# Patient Record
Sex: Female | Born: 1965 | Hispanic: No | Smoking: Never smoker
Health system: Southern US, Community
[De-identification: ages and names within clinical notes are randomized; demographics above are authoritative.]

## PROBLEM LIST (undated history)

## (undated) DIAGNOSIS — R000 Tachycardia, unspecified: Secondary | ICD-10-CM

## (undated) DIAGNOSIS — K219 Gastro-esophageal reflux disease without esophagitis: Secondary | ICD-10-CM

## (undated) DIAGNOSIS — I4891 Unspecified atrial fibrillation: Secondary | ICD-10-CM

## (undated) DIAGNOSIS — N809 Endometriosis, unspecified: Secondary | ICD-10-CM

## (undated) DIAGNOSIS — I10 Essential (primary) hypertension: Secondary | ICD-10-CM

## (undated) DIAGNOSIS — D649 Anemia, unspecified: Secondary | ICD-10-CM

## (undated) DIAGNOSIS — K859 Acute pancreatitis without necrosis or infection, unspecified: Secondary | ICD-10-CM

## (undated) DIAGNOSIS — D219 Benign neoplasm of connective and other soft tissue, unspecified: Secondary | ICD-10-CM

## (undated) HISTORY — PX: TONSILLECTOMY: SHX5217

## (undated) HISTORY — DX: Anemia, unspecified: D64.9

---

## 1993-07-18 HISTORY — PX: LAPAROSCOPIC TUBAL LIGATION: SHX1937

## 2001-08-04 ENCOUNTER — Emergency Department (HOSPITAL_COMMUNITY): Admission: EM | Admit: 2001-08-04 | Discharge: 2001-08-05 | Payer: Self-pay | Admitting: Internal Medicine

## 2001-12-17 ENCOUNTER — Ambulatory Visit (HOSPITAL_COMMUNITY): Admission: RE | Admit: 2001-12-17 | Discharge: 2001-12-17 | Payer: Self-pay | Admitting: Internal Medicine

## 2001-12-17 ENCOUNTER — Encounter: Payer: Self-pay | Admitting: Internal Medicine

## 2004-10-13 ENCOUNTER — Emergency Department (HOSPITAL_COMMUNITY): Admission: EM | Admit: 2004-10-13 | Discharge: 2004-10-13 | Payer: Self-pay | Admitting: Emergency Medicine

## 2008-09-25 ENCOUNTER — Emergency Department (HOSPITAL_COMMUNITY): Admission: EM | Admit: 2008-09-25 | Discharge: 2008-09-25 | Payer: Self-pay | Admitting: Emergency Medicine

## 2008-09-30 ENCOUNTER — Emergency Department (HOSPITAL_COMMUNITY): Admission: EM | Admit: 2008-09-30 | Discharge: 2008-09-30 | Payer: Self-pay | Admitting: Emergency Medicine

## 2009-10-10 ENCOUNTER — Emergency Department (HOSPITAL_COMMUNITY): Admission: EM | Admit: 2009-10-10 | Discharge: 2009-10-10 | Payer: Self-pay | Admitting: Emergency Medicine

## 2010-07-18 HISTORY — PX: ERCP W/ SPHICTEROTOMY: SHX1523

## 2010-09-16 DIAGNOSIS — K859 Acute pancreatitis without necrosis or infection, unspecified: Secondary | ICD-10-CM

## 2010-09-16 HISTORY — DX: Acute pancreatitis without necrosis or infection, unspecified: K85.90

## 2010-09-16 HISTORY — PX: LAPAROSCOPIC CHOLECYSTECTOMY: SUR755

## 2010-09-19 ENCOUNTER — Inpatient Hospital Stay (HOSPITAL_COMMUNITY)
Admission: AD | Admit: 2010-09-19 | Discharge: 2010-09-25 | DRG: 418 | Disposition: A | Discharge status: Home / Self Care | Payer: Medicaid Other | Source: Ambulatory Visit | Attending: Internal Medicine | Admitting: Internal Medicine

## 2010-09-19 DIAGNOSIS — K2960 Other gastritis without bleeding: Secondary | ICD-10-CM | POA: Diagnosis present

## 2010-09-19 DIAGNOSIS — K859 Acute pancreatitis without necrosis or infection, unspecified: Secondary | ICD-10-CM

## 2010-09-19 DIAGNOSIS — K851 Biliary acute pancreatitis without necrosis or infection: Secondary | ICD-10-CM

## 2010-09-19 DIAGNOSIS — K8000 Calculus of gallbladder with acute cholecystitis without obstruction: Secondary | ICD-10-CM | POA: Diagnosis present

## 2010-09-19 DIAGNOSIS — E860 Dehydration: Secondary | ICD-10-CM | POA: Diagnosis present

## 2010-09-19 DIAGNOSIS — K8020 Calculus of gallbladder without cholecystitis without obstruction: Secondary | ICD-10-CM

## 2010-09-19 DIAGNOSIS — F411 Generalized anxiety disorder: Secondary | ICD-10-CM | POA: Diagnosis present

## 2010-09-19 DIAGNOSIS — K219 Gastro-esophageal reflux disease without esophagitis: Secondary | ICD-10-CM | POA: Diagnosis present

## 2010-09-19 DIAGNOSIS — I10 Essential (primary) hypertension: Secondary | ICD-10-CM | POA: Diagnosis present

## 2010-09-19 LAB — GLUCOSE, CAPILLARY: Glucose-Capillary: 140 mg/dL — ABNORMAL HIGH (ref 70–99)

## 2010-09-20 LAB — DIFFERENTIAL
Basophils Absolute: 0 10*3/uL (ref 0.0–0.1)
Eosinophils Relative: 0 % (ref 0–5)
Lymphocytes Relative: 5 % — ABNORMAL LOW (ref 12–46)
Monocytes Absolute: 0.6 10*3/uL (ref 0.1–1.0)

## 2010-09-20 LAB — HEMOGLOBIN A1C: Mean Plasma Glucose: 108 mg/dL (ref <117)

## 2010-09-20 LAB — CBC
HCT: 32.6 % — ABNORMAL LOW (ref 36.0–46.0)
MCH: 24.6 pg — ABNORMAL LOW (ref 26.0–34.0)
MCHC: 32.2 g/dL (ref 30.0–36.0)
MCV: 76.5 fL — ABNORMAL LOW (ref 78.0–100.0)
RDW: 16.3 % — ABNORMAL HIGH (ref 11.5–15.5)

## 2010-09-20 LAB — COMPREHENSIVE METABOLIC PANEL
Alkaline Phosphatase: 188 U/L — ABNORMAL HIGH (ref 39–117)
BUN: 6 mg/dL (ref 6–23)
CO2: 22 mEq/L (ref 19–32)
Chloride: 107 mEq/L (ref 96–112)
Glucose, Bld: 89 mg/dL (ref 70–99)
Potassium: 3 mEq/L — ABNORMAL LOW (ref 3.5–5.1)
Total Bilirubin: 1 mg/dL (ref 0.3–1.2)

## 2010-09-20 LAB — GLUCOSE, CAPILLARY
Glucose-Capillary: 92 mg/dL (ref 70–99)
Glucose-Capillary: 90 mg/dL (ref 70–99)

## 2010-09-20 LAB — AMYLASE: Amylase: 1235 U/L — ABNORMAL HIGH (ref 0–105)

## 2010-09-20 LAB — LIPASE, BLOOD: Lipase: 900 U/L — ABNORMAL HIGH (ref 11–59)

## 2010-09-21 LAB — DIFFERENTIAL
Basophils Relative: 0 % (ref 0–1)
Eosinophils Absolute: 0.1 10*3/uL (ref 0.0–0.7)
Lymphs Abs: 0.8 10*3/uL (ref 0.7–4.0)
Monocytes Absolute: 0.6 10*3/uL (ref 0.1–1.0)
Monocytes Relative: 6 % (ref 3–12)
Neutro Abs: 8.5 10*3/uL — ABNORMAL HIGH (ref 1.7–7.7)

## 2010-09-21 LAB — BASIC METABOLIC PANEL
BUN: 3 mg/dL — ABNORMAL LOW (ref 6–23)
Chloride: 102 mEq/L (ref 96–112)
Creatinine, Ser: 0.55 mg/dL (ref 0.4–1.2)
Glucose, Bld: 96 mg/dL (ref 70–99)
Potassium: 2.6 mEq/L — CL (ref 3.5–5.1)

## 2010-09-21 LAB — POTASSIUM: Potassium: 3.3 mEq/L — ABNORMAL LOW (ref 3.5–5.1)

## 2010-09-21 LAB — CBC
Hemoglobin: 9.9 g/dL — ABNORMAL LOW (ref 12.0–15.0)
MCH: 24.5 pg — ABNORMAL LOW (ref 26.0–34.0)
MCHC: 32 g/dL (ref 30.0–36.0)
MCV: 76.5 fL — ABNORMAL LOW (ref 78.0–100.0)

## 2010-09-21 LAB — AMYLASE: Amylase: 632 U/L — ABNORMAL HIGH (ref 0–105)

## 2010-09-21 LAB — GLUCOSE, CAPILLARY
Glucose-Capillary: 85 mg/dL (ref 70–99)
Glucose-Capillary: 92 mg/dL (ref 70–99)
Glucose-Capillary: 122 mg/dL — ABNORMAL HIGH (ref 70–99)

## 2010-09-22 ENCOUNTER — Inpatient Hospital Stay (HOSPITAL_COMMUNITY): Payer: Medicaid Other

## 2010-09-22 DIAGNOSIS — K838 Other specified diseases of biliary tract: Secondary | ICD-10-CM

## 2010-09-22 LAB — BASIC METABOLIC PANEL
BUN: 2 mg/dL — ABNORMAL LOW (ref 6–23)
CO2: 25 mEq/L (ref 19–32)
Chloride: 106 mEq/L (ref 96–112)
Glucose, Bld: 84 mg/dL (ref 70–99)
Potassium: 3.6 mEq/L (ref 3.5–5.1)

## 2010-09-22 LAB — AMYLASE: Amylase: 235 U/L — ABNORMAL HIGH (ref 0–105)

## 2010-09-22 LAB — GLUCOSE, CAPILLARY
Glucose-Capillary: 88 mg/dL (ref 70–99)
Glucose-Capillary: 96 mg/dL (ref 70–99)
Glucose-Capillary: 104 mg/dL — ABNORMAL HIGH (ref 70–99)
Glucose-Capillary: 105 mg/dL — ABNORMAL HIGH (ref 70–99)

## 2010-09-23 LAB — CBC
HCT: 30.9 % — ABNORMAL LOW (ref 36.0–46.0)
MCH: 25 pg — ABNORMAL LOW (ref 26.0–34.0)
MCV: 77.3 fL — ABNORMAL LOW (ref 78.0–100.0)
Platelets: 270 10*3/uL (ref 150–400)
RBC: 4 MIL/uL (ref 3.87–5.11)

## 2010-09-23 LAB — DIFFERENTIAL
Basophils Absolute: 0 10*3/uL (ref 0.0–0.1)
Basophils Relative: 0 % (ref 0–1)
Eosinophils Relative: 3 % (ref 0–5)
Monocytes Absolute: 0.7 10*3/uL (ref 0.1–1.0)
Neutro Abs: 6.1 10*3/uL (ref 1.7–7.7)

## 2010-09-23 LAB — COMPREHENSIVE METABOLIC PANEL
ALT: 73 U/L — ABNORMAL HIGH (ref 0–35)
Alkaline Phosphatase: 111 U/L (ref 39–117)
BUN: 3 mg/dL — ABNORMAL LOW (ref 6–23)
CO2: 24 mEq/L (ref 19–32)
Chloride: 108 mEq/L (ref 96–112)
GFR calc non Af Amer: >60 mL/min (ref >60)
Glucose, Bld: 118 mg/dL — ABNORMAL HIGH (ref 70–99)
Potassium: 3.6 mEq/L (ref 3.5–5.1)
Sodium: 138 mEq/L (ref 135–145)
Total Bilirubin: 0.6 mg/dL (ref 0.3–1.2)
Total Protein: 5.6 g/dL — ABNORMAL LOW (ref 6.0–8.3)

## 2010-09-23 LAB — GLUCOSE, CAPILLARY
Glucose-Capillary: 138 mg/dL — ABNORMAL HIGH (ref 70–99)
Glucose-Capillary: 147 mg/dL — ABNORMAL HIGH (ref 70–99)

## 2010-09-24 ENCOUNTER — Other Ambulatory Visit: Payer: Self-pay | Admitting: General Surgery

## 2010-09-24 LAB — DIFFERENTIAL
Basophils Absolute: 0 10*3/uL (ref 0.0–0.1)
Eosinophils Relative: 4 % (ref 0–5)
Lymphocytes Relative: 17 % (ref 12–46)
Lymphs Abs: 1.1 10*3/uL (ref 0.7–4.0)
Neutro Abs: 4.4 10*3/uL (ref 1.7–7.7)
Neutrophils Relative %: 71 % (ref 43–77)

## 2010-09-24 LAB — CBC
HCT: 30.1 % — ABNORMAL LOW (ref 36.0–46.0)
MCV: 76.8 fL — ABNORMAL LOW (ref 78.0–100.0)
RBC: 3.92 MIL/uL (ref 3.87–5.11)
RDW: 16.5 % — ABNORMAL HIGH (ref 11.5–15.5)
WBC: 6.2 10*3/uL (ref 4.0–10.5)

## 2010-09-24 LAB — BASIC METABOLIC PANEL
BUN: 3 mg/dL — ABNORMAL LOW (ref 6–23)
Chloride: 106 mEq/L (ref 96–112)
GFR calc non Af Amer: >60 mL/min (ref >60)
Glucose, Bld: 107 mg/dL — ABNORMAL HIGH (ref 70–99)
Potassium: 3.7 mEq/L (ref 3.5–5.1)
Sodium: 138 mEq/L (ref 135–145)

## 2010-09-24 LAB — H. PYLORI ANTIBODY, IGG: H Pylori IgG: 1.6 {ISR} — ABNORMAL HIGH

## 2010-09-24 LAB — IRON AND TIBC
Saturation Ratios: 7 % — ABNORMAL LOW (ref 20–55)
UIBC: 286 ug/dL

## 2010-10-11 LAB — WET PREP, GENITAL
WBC, Wet Prep HPF POC: NONE SEEN
Yeast Wet Prep HPF POC: NONE SEEN

## 2010-10-11 LAB — GC/CHLAMYDIA PROBE AMP, GENITAL: Chlamydia, DNA Probe: NEGATIVE

## 2010-10-11 LAB — CBC
HCT: 36.2 % (ref 36.0–46.0)
Hemoglobin: 12.4 g/dL (ref 12.0–15.0)
MCHC: 34.2 g/dL (ref 30.0–36.0)
RBC: 4.7 MIL/uL (ref 3.87–5.11)

## 2010-10-15 NOTE — Op Note (Signed)
Elaine [name], Elaine [name]                 ACCOUNT NO.:  [id number]  MEDICAL RECORD NO.:  [id number]           PATIENT TYPE:  I  LOCATION:  A327                          FACILITY:  [facility]  PHYSICIAN:  Tilford Pillar, MD      DATE OF BIRTH:  04/11/1966  DATE OF PROCEDURE: DATE OF DISCHARGE:                              OPERATIVE REPORT   PREOPERATIVE DIAGNOSIS:  Acute cholecystitis (acute gallstone pancreatitis).  POSTOPERATIVE DIAGNOSIS:  Acute cholecystitis (acute gallstone pancreatitis).  PROCEDURE:  Laparoscopic cholecystectomy.  SURGEON:  Tilford Pillar, MD  ANESTHESIA:  General endotracheal, local anesthetic, 0.5% Sensorcaine plain.  SPECIMEN:  Gallbladder.  ESTIMATED BLOOD LOSS:  Minimal.  INDICATIONS:  The patient is a 45 year old female who presented to [facility] with acute onset of epigastric abdominal pain.  Her workup was consistent for a pancreatitis, continues workup was evidence for gallstone pancreatitis.  During hospital stay, she did undergo an ERCP and sphincterotomy and at this point, the risks, benefits, and alternatives of laparoscopic possible open cholecystectomy were discussed at length with the patient including, but not limited to risk of bleeding, infection, bile leak, small bowel injury, common bile duct as well as possibility of intraoperative cardiac and pulmonary events. Her questions and concerns were addressed and the patient was consented for the planned procedure.  OPERATIONS:  The patient was taken to the operating room, was placed in the supine position on the operating room table.  At this time, the general anesthetic was administered.  Once the patient was asleep, she was endotracheally intubated by Anesthesia.  At this time, her abdomen was prepped with DuraPrep solution and draped in a standard fashion.  A stab incision was created supraumbilically with 11-blade scalpel. Additional dissection down through the subcuticular tissue  was carried out using a Kocher clamp, which was utilized to grasp the anterior abdominal wall fascia and moved this anteriorly.  A Veress needle was inserted.  Saline drop test was utilized to confirm intraperitoneal placement and then pneumoperitoneum was initiated.  Once sufficient pneumoperitoneum was obtained, an 11-mm trocar was inserted over the laparoscope allowing visualization of the trocar entering into the peritoneal cavity.  At this time, the inner cannula was removed.  The laparoscope was reinserted.  There was no evidence of any trocar or Veress needle placement injury.  At this time, the remaining trocars were placed with an 11-mm trocar in the epigastrium, a 5-mm trocar in the midline between two 11-mm trocars and 5-mm trocars in the right lateral abdominal wall.  The patient was then placed into a reversed Trendelenburg left lateral decubitus position.  The fundus of the gallbladder was grasped and lifted up and over the right lobe of the liver.  At this time, the peritoneal reflection onto the infundibulum was bluntly stripped using a [place] exposing the cystic duct and cystic artery as they entered into the infundibulum.  A window was created behind the cystic duct.  Two EndoClips were placed proximally and one distally and the cystic duct was divided between two most distal clips.  Similarly, the cystic artery was identified.  Two EndoClips were  placed proximally and one distally and the cystic artery was divided between the two most distal clips.  At this time, I did continue the dissection with electrocautery and dissect the gallbladder free from the gallbladder fossa.  Due to the acute inflammatory process. the gallbladder bed was noted to be raw.  Hemostasis was obtained with electrocautery.  I did copiously irrigate the field, returning aspirate was clear.  The gallbladder was placed into an EndoCatch bag and placed up and over the right lobe of the  liver.  At this time, the inspection of the EndoClips demonstrate no evidence any bleeding or bile leak and I did opt at this point to place a piece of Surgicel SnoW into the gallbladder fossa to help with hemostasis.  At this time, the attention was turned to closure.  Using an Endoclose suture passing device, a 2-0 Vicryl suture was passed through both the 11-mm trocar sites.  At this point, the gallbladder was grasped and retrieved through the umbilical trocar site.  Some blunt and sharp dilatation was required to enlarge the umbilical trocar site adequately enough to remove the gallbladder.  The gallbladder was removed in an intact EndoCatch bag and was placed on the back table as a permanent specimen for pathology.  At this time, the pneumoperitoneum was evacuated.  The trocars were removed.  The Vicryl sutures were secured.  Local anesthetic was instilled.  A 4-0 Monocryl was utilized to reapproximate the skin edges at all 4 trocar sites.  The skin was washed and dried with moist dry towel.  Benzoin was applied around the incision.  Half-inch Steri-Strips were placed.  Drapes were removed. The patient was allowed to come out of the general anesthetic and was transferred back to regular hospital bed in stable condition.  At the conclusion of procedure, all instrument, sponge, and needle counts were correct.  The patient tolerated the procedure extremely well.     Tilford Pillar, MD     BZ/MEDQ  D:  09/24/2010  T:  09/25/2010  Job:  [id number]  Electronically Signed by Tilford Pillar MD on 10/15/2010 12:22:02 PM

## 2010-10-19 NOTE — H&P (Signed)
NAMELANGSTON, SUMMERFIELD                 ACCOUNT NO.:  [id number]  MEDICAL RECORD NO.:  [id number]           PATIENT TYPE:  I  LOCATION:  A327                          FACILITY:  [facility]  PHYSICIAN:  Lionel December, M.D.    DATE OF BIRTH:  02-23-1966  DATE OF ADMISSION:  09/19/2010 DATE OF DISCHARGE:  LH                             HISTORY & PHYSICAL   REASON FOR ADMISSION:  Biliary pancreatitis.  Please note, the patient was transferred from emergency room at [facility] [place] Dr. Gwendolyn Fill.  HISTORY OF PRESENT ILLNESS:  Chrishauna is 45 year old Hispanic female whose present illness began about 2 weeks ago.  She thought she had a "stomach flu."  She had lot of indigestion and then she began to have pain across her upper abdomen, right upper quadrant and also has experienced pain on her left shoulder.  Her pain got worse over the last 2 days and she also developed nausea and vomiting.  This morning, she went over to emergency room at [facility] in [place], [place].  She was evaluated by, Dr. Gwendolyn Fill, and on workup noted to have markedly elevated serum amylase and lipase as well as elevated bilirubin and transaminases.  She had upper abdominal ultrasound which revealed cholelithiasis and bile duct measuring 15 mm.  Dr. Gwendolyn Fill talked with Dr. Leticia Penna, who was providing a surgical coverage for [facility] and he felt this patient needed to have ERCP prior to cholecystectomy and patient therefore was transferred to my service.  Reata states that she has had symptoms off and on for 16 years.  On few occasions, she has been evaluated for chest pain.  She says about 2 years ago, she is seen in emergency room here as well as at [facility].  She was discharged from the ER on aspirin, Lopressor, and PPI and advised to have followup for cardiac workup, but she did not pursue it.  She states she does not have an insurance and decided not to.  In the meantime, she managed to lose close to  100 pounds and she states her blood pressure has remained normal.  She is currently being treated at [facility].  She has been felt to have GERD or dyspepsia or anxiety attacks, but never had an ultrasound until today.  She says prior to onset of these symptoms, she has good appetite.  She denies fever, chills, nausea, or vomiting.  Her appetite has been good.  She also denies dysphagia or odynophagia, or chest pain.  Review of the systems is also negative for melena, rectal bleeding, dysuria, hematuria.  At home, she has been on OTC medications which include Advair, Rolaids, and other meds for GERD.  PAST MEDICAL HISTORY:  She has had symptoms of GERD for few years and treated with OTC meds.  She had tonsillectomy at age 34, history of obesity but has managed to lose lot of weight in last 2 years.  History of anxiety.  Hypertension which has been inactive since she lost weight.  ALLERGIES:  NK.  FAMILY HISTORY:  Mother at 60, has thyroid disease as well as  diabetes mellitus.  Father died at age 55.  She has a brother with diabetes and hypertension and a sister with thyroid problems.  SOCIAL HISTORY:  She is married.  She has five children in good health. 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  She drinks alcohol occasionally.  She does not smoke cigarettes.  OBJECTIVE:  VITAL SIGNS:  Weight 90.7 kg, she is 61 inches tall, pulse 61 per minute, blood pressure 148/81, respirations 18, temp is 98.6. HEENT:  Conjunctivae is pink.  Sclera is mildly icteric.  Oropharyngeal mucosa is normal.  No neck masses or thyromegaly noted.  CARDIAC: Regular rhythm.  Normal S1 and S2.  No murmur or gallop noted. LUNGS:  Clear to auscultation. ABDOMEN:  Full.  Bowel sounds are normal.  On palpation, soft abdomen with mild tenderness at LLQ with moderate tenderness in midepigastric region.  No organomegaly or masses. RECTAL:  Deferred. EXTREMITIES:  No peripheral edema or clubbing  noted.  She does have multiple tattoos one above her right ankle.  Laboratory data from [facility]: WBC 6.2, H and H is 11.5 and 35, MCV is 75.3, segs 83%, platelet count is 316,000.  Urinalysis significant for ketones, 0-5 wbc's.  Serum amylase is 4437.  Lipase is 7093.  Serum sodium 136, potassium 4.0, chloride 106, CO2 of 22, glucose 177, BUN 11, creatinine 0.67, total bilirubin is 2.8, alkaline phosphatase 258, SGOT 240, SGPT 362, total protein 7.5 with albumin of 3.8.  Ultrasound pancreas, not well seen because of overlying gas, bile duct measures 15 mm with no stones obvious, gallbladder distended with at least one stone.  No pericholecystic fluid or wall thickening noted.  ASSESSMENT:  Lesley is 45 year old female who has biliary pancreatitis. She has markedly dilated bile duct.  Therefore, choledocholithiasis suspected.  She appears to be hemodynamically stable and has preserved renal function.  She may be slightly dehydrated and she had some ketones in her urine.  No history of diabetes.  Her glucose is elevated, but her CO2 and anion gap are normal.  As she improves symptomatically as far as a biliary pancreatitis is concerned, she will need ERCP, sphincterotomy clear duct appraised to cholecystectomy.  PLAN: 1. She will stay n.p.o. for now except ice chips. 2. Sodium chloride at 150 mL per hour.  We will monitor her I's and     O's. 3. Protonix 40 mg IV q.24 hours.  We will use Dilaudid 1 mg IV q.3     p.r.n. pain and Zofran 4 mg IV q.6 p.r.n.  We will also start on     Unasyn 1.5 mg daily.  Glucose is elevated, we will start on sliding scale coverage.  We will repeat her lab studies in a.m. consisting of CBC, comprehensive chemistry panel, amylase, lipase, PT/PTT, as well as hemoglobin A1c.  We will also consult Dr. Leticia Penna.     Lionel December, M.D.     NR/MEDQ  D:  09/19/2010  T:  09/19/2010  Job:  [id number]  cc:   Tilford Pillar, MD Fax: [phone]  [facility] Department  Electronically Signed by Lionel December M.D. on 10/19/2010 09:32:49 AM

## 2010-10-19 NOTE — Op Note (Signed)
  [name], [name]                 ACCOUNT NO.:  [id number]  MEDICAL RECORD NO.:  [id number]           PATIENT TYPE:  I  LOCATION:  A327                          FACILITY:  [facility]  PHYSICIAN:  Lionel December, M.D.    DATE OF BIRTH:  05/05/1966  DATE OF PROCEDURE:  09/22/2010 DATE OF DISCHARGE:                              OPERATIVE REPORT   PROCEDURE:  ERCP with biliary sphincterotomy.  INDICATION:  Elaine [name] is 45 year old female who presents with biliary pancreatitis, elevated bilirubin, and transaminases and a dilated bile duct.  She has improved with conservative therapy.  She is undergoing therapeutic ERCP prior to cholecystectomy.  Procedures were reviewed with the patient.  Informed consent was obtained.  MEDS FOR SEDATION/ANESTHESIA:  Please see anesthesia records for details.  FINDINGS:  Procedure performed in the OR.  The patient was placed under anesthesia intubated and turned into semi-prone position.  Therapeutic Pentax video duodenoscope was passed via oropharynx without any difficulty into esophagus and stomach.  Scattered antral erosions were noted.  Pyloric channel was patent.  Scope was passed above the second part of the duodenum.  Ampulla appeared to be normal.  Pictures were taken for the record.  Cannulation was attempted with RX 44 autotome and 035 Hydra Jagwire.  CBD was selectively cannulated and filled with dilute contrast.  CBD and CHD were dilated to about 9-10 mm, but no filling defects noted.  Distal segment was difficult to see despite placing the patient in anti-Trendelenburg position.  Biliary sphincterotomy was performed with gush of contrast and bile in the duodenum.  Balloon stone extractor was trolled through the duct twice with removal of some debris, but no stone seen.  The drainage was excellent.  Endoscope was withdrawn.  The patient tolerated the procedure well.  FINAL DIAGNOSES: 1. Erosive antral gastritis. 2. Normal appearing ampulla of  water with the dilated common bile duct     and common hepatic duct.  Biliary sphincterotomy performed with     removal of some debris, but no stones were found. 3. Pancreatic duct was not cannulated or filled with contrast.  RECOMMENDATIONS: 1. Clear liquids today.  She will have labs repeated in a.m. 2. Lap chole per Dr. Leticia Penna.     Lionel December, M.D.     NR/MEDQ  D:  09/22/2010  T:  09/22/2010  Job:  [id number]  cc:   Tilford Pillar, MD Fax: [phone]  [facility] Medical Department  Electronically Signed by Lionel December M.D. on 10/19/2010 09:35:35 AM

## 2010-10-24 NOTE — Discharge Summary (Signed)
Elaine [name], CLAUSON                 ACCOUNT NO.:  [id number]  MEDICAL RECORD NO.:  [id number]           PATIENT TYPE:  I  LOCATION:  A327                          FACILITY:  [facility]  PHYSICIAN:  Lionel December, M.D.    DATE OF BIRTH:  July 06, 1966  DATE OF ADMISSION:  09/19/2010 DATE OF DISCHARGE:  03/10/2012LH                              DISCHARGE SUMMARY   DISCHARGE DIAGNOSES: 1. Biliary pancreatitis. 2. Jaundice secondary to biliary pancreatitis. 3. Cholelithiasis. 4. Hypokalemia. 5. Mild anemia. 6. H.Pylori Gastritis to be treated as outpatient.  CONDITION AT TIME TIME OF DISCHARGE:  Much improved.  PRINCIPAL PROCEDURE:  ERCP with biliary sphincterotomy and removal of debris on September 22, 2010, by Dr. Karilyn Cota.  Laparoscopic cholecystectomy on September 24, 2010, by Dr. Tilford Pillar.  DISCHARGE MEDICATIONS: 1. Hydrocodone 5/500 one to two tablets q.4 p.r.n. pain, therapy given     by Dr. Leticia Penna. 2. Advil 200 mg t.i.d. p.r.n. 3. Tylenol 500 mg q.6 p.r.n.  HOSPITAL COURSE:  Elaine [name] is a 45 year old Hispanic female who was transferred to [facility] via emergency room of [facility].  She presented to that facility with upper abdominal pain, nausea, as well as intractable heartburn.  On evaluation, she was noted to be jaundiced.  Her bili was 2.8, alkaline phosphatase was 258, SGOT was 240, SGPT was 362.  Serum amylase was 4437 and lipase was 7093. Her WBC was 6.2.  Her glucose was noted to be 177.  She was transferred to this facility with the thought that she will be needing an ERCP prior to her cholecystectomy.  She also had ultrasound at that facility and bile duct was dilated measuring 15 mm and she also had cholelithiasis. The patient was admitted to regular floor and she was begun on IV fluids.  KCl was added since serum potassium following morning was 3. She was also begun on IV analgesia, Protonix, and Unasyn.  For the first 48 hours or so, she had  significant pain but reasonably well controlled with the medication.  Her transaminases and bilirubin gradually came down and so did her amylase and lipase.  She was well enough for an ERCP which was performed on September 22, 2010.  She had erosive antral gastritis. She had normal-appearing ampulla of Vater with dilated biliary system. Biliary sphincterotomy was performed with removal of debris, but no stone was found.  Postprocedure, she did very well.  She was seen by Dr. Tilford Pillar, and she had laparoscopic cholecystectomy on September 24, 2010.  Once again, she did very well after the surgery.  The patient was begun on diet and she tolerated it well.  On September 24, 2010, her WBC was 6.2, H and H was 9.8 and 30.1.  Her platelet count was normal.  Her glucose was 107.  Serum potassium was 3.7.  Her bilirubin on September 23, 2010, was 0.6, AP was 111, SGOT 14, SGPT 73, and her amylase was 159. Since she had erosive gastritis, I checked her H. pylori serology which was positive.  She will be treated on an outpatient basis.  Since her glucose  level was elevated on admission, I checked a hemoglobin A1c and was normal at 5.4.  The patient was discharged in much improved condition by Dr. Leticia Penna. Written instructions were given to her regarding activity and limits on lifting weights, etc.  She will be seeing Dr. Leticia Penna in 3 weeks.  The patient will also return for OV in a few weeks.  She will have CBC and LFTs prior to that visit.     Lionel December, M.D.     NR/MEDQ  D:  10/19/2010  T:  10/19/2010  Job:  [id number]  cc:   Tilford Pillar, MD Fax: [phone]  Electronically Signed by Lionel December M.D. on 10/24/2010 12:38:27 PM

## 2010-10-28 LAB — POCT CARDIAC MARKERS
CKMB, poc: <1 ng/mL — ABNORMAL LOW (ref 1.0–8.0)
Troponin i, poc: <0.05 ng/mL (ref 0.00–0.09)

## 2010-10-28 LAB — BASIC METABOLIC PANEL
Calcium: 8.5 mg/dL (ref 8.4–10.5)
GFR calc Af Amer: >60 mL/min (ref >60)
GFR calc non Af Amer: >60 mL/min (ref >60)
Potassium: 3.3 mEq/L — ABNORMAL LOW (ref 3.5–5.1)
Sodium: 138 mEq/L (ref 135–145)

## 2010-10-31 ENCOUNTER — Emergency Department (HOSPITAL_COMMUNITY)
Admission: EM | Admit: 2010-10-31 | Discharge: 2010-10-31 | Disposition: A | Discharge status: Home / Self Care | Payer: Medicaid Other | Attending: Emergency Medicine | Admitting: Emergency Medicine

## 2010-10-31 ENCOUNTER — Other Ambulatory Visit: Payer: Self-pay | Admitting: Emergency Medicine

## 2010-10-31 DIAGNOSIS — R109 Unspecified abdominal pain: Secondary | ICD-10-CM | POA: Insufficient documentation

## 2010-10-31 DIAGNOSIS — R509 Fever, unspecified: Secondary | ICD-10-CM | POA: Insufficient documentation

## 2010-10-31 DIAGNOSIS — J029 Acute pharyngitis, unspecified: Secondary | ICD-10-CM | POA: Insufficient documentation

## 2010-11-10 ENCOUNTER — Ambulatory Visit (INDEPENDENT_AMBULATORY_CARE_PROVIDER_SITE_OTHER): Payer: Self-pay | Admitting: Internal Medicine

## 2010-12-02 ENCOUNTER — Other Ambulatory Visit: Payer: Self-pay | Admitting: Obstetrics & Gynecology

## 2010-12-02 ENCOUNTER — Other Ambulatory Visit (HOSPITAL_COMMUNITY)
Admission: RE | Admit: 2010-12-02 | Discharge: 2010-12-02 | Disposition: A | Discharge status: Home / Self Care | Payer: Medicaid Other | Source: Ambulatory Visit | Attending: Obstetrics & Gynecology | Admitting: Obstetrics & Gynecology

## 2010-12-02 ENCOUNTER — Encounter: Payer: Self-pay | Admitting: Obstetrics & Gynecology

## 2010-12-02 DIAGNOSIS — D649 Anemia, unspecified: Secondary | ICD-10-CM | POA: Insufficient documentation

## 2010-12-02 DIAGNOSIS — N920 Excessive and frequent menstruation with regular cycle: Secondary | ICD-10-CM | POA: Insufficient documentation

## 2010-12-02 DIAGNOSIS — Z01419 Encounter for gynecological examination (general) (routine) without abnormal findings: Secondary | ICD-10-CM | POA: Insufficient documentation

## 2012-01-04 ENCOUNTER — Emergency Department (HOSPITAL_COMMUNITY)
Admission: EM | Admit: 2012-01-04 | Discharge: 2012-01-04 | Disposition: A | Discharge status: Home / Self Care | Payer: Medicaid Other | Attending: Emergency Medicine | Admitting: Emergency Medicine

## 2012-01-04 ENCOUNTER — Encounter (HOSPITAL_COMMUNITY): Payer: Self-pay

## 2012-01-04 DIAGNOSIS — N898 Other specified noninflammatory disorders of vagina: Secondary | ICD-10-CM | POA: Insufficient documentation

## 2012-01-04 DIAGNOSIS — N920 Excessive and frequent menstruation with regular cycle: Secondary | ICD-10-CM

## 2012-01-04 HISTORY — DX: Benign neoplasm of connective and other soft tissue, unspecified: D21.9

## 2012-01-04 HISTORY — DX: Endometriosis, unspecified: N80.9

## 2012-01-04 LAB — CBC
HCT: 38.3 % (ref 36.0–46.0)
MCV: 80.1 fL (ref 78.0–100.0)
Platelets: 265 10*3/uL (ref 150–400)
RBC: 4.78 MIL/uL (ref 3.87–5.11)
WBC: 6.6 10*3/uL (ref 4.0–10.5)

## 2012-01-04 LAB — URINALYSIS, ROUTINE W REFLEX MICROSCOPIC
Glucose, UA: NEGATIVE mg/dL
Hgb urine dipstick: NEGATIVE
Leukocytes, UA: NEGATIVE
Specific Gravity, Urine: 1.015 (ref 1.005–1.030)
Urobilinogen, UA: 0.2 mg/dL (ref 0.0–1.0)

## 2012-01-04 MED ORDER — MEGESTROL ACETATE 40 MG PO TABS: ORAL_TABLET | ORAL | Status: DC

## 2012-01-04 NOTE — ED Notes (Addendum)
Pt reports has uterine fibroids and endometriosis.  Started period 2 1/2 weeks ago but reports  has had heavy vaginal bleeding for the past 4 days.  C/O abd cramping and burning with urination.

## 2012-01-04 NOTE — ED Provider Notes (Signed)
History  This chart was scribed for Elaine Gaskins, MD by Bennett Scrape. This patient was seen in room APA05/APA05 and the patient's care was started at 7:27AM.  CSN: [id number]  Arrival date & time 01/04/12  7829   First MD Initiated Contact with Patient 01/04/12 [phone]      Chief Complaint  Patient presents with  . Vaginal Bleeding    Patient is a 46 y.o. female presenting with vaginal bleeding. The history is provided by the patient. No language interpreter was used.  Vaginal Bleeding This is a recurrent problem. The current episode started more than 1 week ago. The problem occurs constantly. The problem has been gradually worsening. The symptoms are aggravated by exertion. Nothing relieves the symptoms.    SAPRINA [name] is a 46 y.o. female who presents to the Emergency Department complaining of 2.5 weeks of constant vaginal bleeding with 4 days of increased vaginal bleeding and associated right-sided suprapubic abdominal cramping. Pt reports that she has superceded her tampon that she put in approximately 30 minutes ago. 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 Pt states that she has a h/o fibroids and was told by her gynecologist that she would need a hysterectomy back in January 2012 but states that she has been putting it off waiting on her medicaid to come through. She reports that she has been experiencing these symptoms since December 2012 and is now having abdominal pain when she is not on her period. She also c/o dysuria that she attributes to pancreatitis. She reports experiencing one prior episode of pancreatitis. She denies nausea, weakness, back pain, chest pain, fever, agitation  and cough as associated symptoms. She also reports that she was diagnosed with endometriosis through CT scan about one month ago at [name]. She denies smoking and alcohol use.   Dr. Glenford Peers is her Gynecologist. PCP is Dr. Karilyn Cota.  Past Medical  History  Diagnosis Date  . Anemia   . Fibroids   . Endometriosis   pancreatitis   Past Surgical History  Procedure Date  . Tonsillectomy     child  . Laparoscopic tubal ligation 1995  . Laparoscopic cholecystectomy 09/2010    Family History  Problem Relation Age of Onset  . Heart attack Father     History  Substance Use Topics  . Smoking status: Never Smoker   . Smokeless tobacco: Not on file  . Alcohol Use: No    OB History    Grav Para Term Preterm Abortions TAB SAB Ect Mult Living   6 6 6  0 0 0 0 0 0 6      Review of Systems   A complete 10 system review of systems was obtained and all systems are negative except as noted in the HPI and PMH.   Allergies  Review of patient's allergies indicates no known allergies.  Home Medications  No current outpatient prescriptions on file.  Triage Vitals: BP 181/89  Pulse 89  Temp 97.6 F (36.4 C) (Oral)  Resp 20  Ht 5\' 2"  (1.575 m)  Wt 185 lb (83.915 kg)  BMI 33.84 kg/m2  SpO2 100%  LMP 12/21/2011  Physical Exam  Nursing note and vitals reviewed.  CONSTITUTIONAL: Well developed/well nourished HEAD AND FACE: Normocephalic/atraumatic EYES: EOMI/PERRL ENMT: Mucous membranes moist NECK: supple no meningeal signs SPINE:entire spine nontender CV: S1/S2 noted, no murmurs/rubs/gallops noted LUNGS: Lungs are clear to auscultation bilaterally, no apparent distress ABDOMEN: soft, nontender,  no rebound or guarding GU:no cva tenderness.  Vaginal bleeding noted, blood noted in vault.  No cmt.  No lacerations.  Chaperone present NEURO: Pt is awake/alert, moves all extremitiesx4 EXTREMITIES: pulses normal, full ROM SKIN: warm, color normal PSYCH: no abnormalities of mood noted  ED Course  Procedures  DIAGNOSTIC STUDIES: Oxygen Saturation is 100% on room air, normal by my interpretation.    COORDINATION OF CARE: 7:42AM-Discussed treatment plan which includes a pelvic exam and blood work with pt and pt agreed to  plan.    D/w dr Despina Hidden  we discussed history/exam/labs He recommends to start megace and he will see in 2 weeks Pt denies h/o DVT/PE denies h/o CAD and she does not smoke   The patient appears reasonably screened and/or stabilized for discharge and I doubt any other medical condition or other [name] requiring further screening, evaluation, or treatment in the ED at this time prior to discharge.   Labs Reviewed  URINALYSIS, ROUTINE W REFLEX MICROSCOPIC      MDM  Nursing notes including past medical history and social history reviewed and considered in documentation All labs/vitals reviewed and considered    I personally performed the services described in this documentation, which was scribed in my presence. The recorded information has been reviewed and considered.        Elaine Gaskins, MD 01/04/12 1108

## 2012-01-04 NOTE — Discharge Instructions (Signed)
Abnormal Uterine Bleeding Abnormal uterine bleeding can have many causes. Some cases are simply treated, while others are more serious. There are several kinds of bleeding that is considered abnormal, including:  Bleeding between periods.   Bleeding after sexual intercourse.   Spotting anytime in the menstrual cycle.   Bleeding heavier or more than normal.   Bleeding after menopause.  CAUSES  There are many causes of abnormal uterine bleeding. It can be present in teenagers, pregnant women, women during their reproductive years, and women who have reached menopause. Your caregiver will look for the more common causes depending on your age, signs, symptoms and your particular circumstance. Most cases are not serious and can be treated. Even the more serious causes, like cancer of the female organs, can be treated adequately if found in the early stages. That is why all types of bleeding should be evaluated and treated as soon as possible. DIAGNOSIS  Diagnosing the cause may take several kinds of tests. Your caregiver may:  Take a complete history of the type of bleeding.   Perform a complete physical exam and Pap smear.   Take an ultrasound on the abdomen showing a picture of the female organs and the pelvis.   Inject dye into the uterus and Fallopian tubes and X-ray them (hysterosalpingogram).   Place fluid in the uterus and do an ultrasound (sonohysterogrqphy).   Take a CT scan to examine the female organs and pelvis.   Take an MRI to examine the female organs and pelvis. There is no X-ray involved with this procedure.   Look inside the uterus with a telescope that has a light at the end (hysteroscopy).   Scrap the inside of the uterus to get tissue to examine (Dilatation and Curettage, D&C).   Look into the pelvis with a telescope that has a light at the end (laparoscopy). This is done through a very small cut (incision) in the abdomen.  TREATMENT  Treatment will depend on the  cause of the abnormal bleeding. It can include:  Doing nothing to allow the problem to take care of itself over time.   Hormone treatment.   Birth control pills.   Treating the medical condition causing the problem.   Laparoscopy.   Major or minor surgery   Destroying the lining of the uterus with electrical currant, laser, freezing or heat (uterine ablation).  HOME CARE INSTRUCTIONS   Follow your caregiver's recommendation on how to treat your problem.   See your caregiver if you missed a menstrual period and think you may be pregnant.   If you are bleeding heavily, count the number of pads/tampons you use and how often you have to change them. Tell this to your caregiver.   Avoid sexual intercourse until the problem is controlled.  SEEK MEDICAL CARE IF:   You have any kind of abnormal bleeding mentioned above.   You feel dizzy at times.   You are 46 years old and have not had a menstrual period yet.  SEEK IMMEDIATE MEDICAL CARE IF:   You pass out.   You are changing pads/tampons every 15 to 30 minutes.   You have belly (abdominal) pain.   You have a temperature of 100 F (37.8 C) or higher.   You become sweaty or weak.   You are passing large blood clots from the vagina.   You start to feel sick to your stomach (nauseous) and throw up (vomit).  Document Released: 07/04/2005 Document Revised: 06/23/2011 Document Reviewed: 11/27/2008 [employer]   Patient Information 2012 [employer]. 

## 2012-01-04 NOTE — ED Notes (Signed)
Patient does not need anything at this time. 

## 2012-02-07 ENCOUNTER — Other Ambulatory Visit (HOSPITAL_COMMUNITY)
Admission: RE | Admit: 2012-02-07 | Discharge: 2012-02-07 | Disposition: A | Discharge status: Home / Self Care | Payer: Medicaid Other | Source: Ambulatory Visit | Attending: Obstetrics & Gynecology | Admitting: Obstetrics & Gynecology

## 2012-02-07 ENCOUNTER — Other Ambulatory Visit: Payer: Self-pay | Admitting: Obstetrics & Gynecology

## 2012-02-07 DIAGNOSIS — Z01419 Encounter for gynecological examination (general) (routine) without abnormal findings: Secondary | ICD-10-CM | POA: Insufficient documentation

## 2012-02-08 NOTE — Patient Instructions (Addendum)
21 Elaine [name]  02/08/2012   Your procedure is scheduled on:  02/15/12  Report to [facility] at  615  AM.  Call this number if you have problems the morning of surgery: [phone]   Remember:   Do not eat food:After Midnight.  May have clear liquids:until Midnight . Marland Kitchen  Take these medicines the morning of surgery with A SIP OF WATER:  Norco   Do not wear jewelry, make-up or nail polish.  Do not wear lotions, powders, or perfumes. You may wear deodorant.  Do not shave 48 hours prior to surgery. Men may shave face and neck.  Do not bring valuables to the hospital.  Contacts, dentures or bridgework may not be worn into surgery.  Leave suitcase in the car. After surgery it may be brought to your room.  For patients admitted to the hospital, checkout time is 11:00 AM the day of discharge.   Patients discharged the day of surgery will not be allowed to drive home.  Name and phone number of your driver: family  Special Instructions: CHG Shower Use Special Wash: 1/2 bottle night before surgery and 1/2 bottle morning of surgery.   Please read over the following fact sheets that you were given: Pain Booklet, MRSA Information, Surgical Site Infection Prevention, Anesthesia Post-op Instructions and Care and Recovery After Surgery Supracervical Hysterectomy A supracervical hysterectomy is minimally invasive surgery to remove the top part of the uterus, but not the cervix. This surgery can be performed by making a large cut (incision) in the abdomen. It can also be done with a thin, lighted tube (laparoscope) inserted into 2 small incisions in the lower abdomen. Your fallopian tubes and ovaries can be removed (bilateral salpingo-oopherectomy) during this surgery as well. If a supracervical hysterectomy is started and it is not safe to continue, the laparoscopic surgery will be converted to an open abdominal surgery. You will not have menstrual periods or be able to get pregnant after having this  surgery. If a bilateral salpingo-oopherectomy was performed before menopause, you will go through a sudden (abrupt) menopause. This can be helped with hormone medicines. Benefits of minimally invasive surgery include:  Less pain.   Less risk of blood loss.   Less risk of infection.   Quicker return to normal activities.   Usually a 1 night stay in the hospital.   Overall patient satisfaction.  LET YOUR CAREGIVER KNOW ABOUT:  Any history of abnormal Pap tests.   Allergies to food or medicine.   Medicines taken, including vitamins, herbs, eyedrops, over-the-counter medicines, and creams.   Use of steroids (by mouth or creams).   Previous problems with anesthetics or numbing medicines.   History of bleeding problems or blood clots.   Previous surgery.   Other health problems, including diabetes and kidney problems.   Any infections or colds you may have developed.   Symptoms of irregular or heavy periods, weight loss, or urinary or bowel changes.  RISKS AND COMPLICATIONS   Bleeding.   Blood clots in the legs or lung.   Infection.   Injury to surrounding organs.   Problems with anesthesia.   Risk of conversion to an open abdominal incision.   Early menopause symptoms (hot flashes, night sweats, insomnia).   Additional surgery later to remove the cervix if you have problems with the cervix.  BEFORE THE PROCEDURE  Ask your caregiver about changing or stopping your regular medicines.   Do not take aspirin or blood thinners (  anticoagulants) for 1 week before the surgery, or as told by your caregiver.   Do not eat or drink anything for 8 hours before the surgery, or as told by your caregiver.   Quit smoking if you smoke.   Arrange for a ride home after surgery and for someone to help you at home during recovery.  PROCEDURE   You will be given an antibiotic medicine.   An intravenous (IV) line will be placed in your arm. You will be given medicine to make you  sleep (general anesthetic).   A gas (carbon dioxide) will be used to inflate your abdomen. This will allow your surgeon to look inside your abdomen, perform your surgery, and treat any other problems found if necessary.   Three or four small incisions (often less than  inch) will be made in your abdomen. One of these incisions will be made in the area of your belly button (navel). The laparoscope will be inserted into the incision. Your surgeon will look through the laparoscope while doing your procedure.   Other surgical instruments will be inserted through the other incisions.   The uterus will be cut into small pieces and removed through the small incisions.   Your incisions will be closed.  AFTER THE PROCEDURE   The gas will be released from inside your abdomen.   You will be taken to the recovery area where a nurse will watch and check your progress. Once you are awake, stable, and taking fluids well, without other problems, you will return to your room or be allowed to go home.   There is usually minimal discomfort following the surgery because the incisions are so small.   You will be given pain medicine while you are in the hospital and for when you go home.   Try to have someone with you for the first 3 to 5 days after you go home.   Follow up with your surgeon in 2 to 4 weeks after surgery to evaluate your progress.  Document Released: 12/21/2007 Document Revised: 06/23/2011 Document Reviewed: 02/18/2011 [facility] Patient Information 2012 [place], [place].PATIENT INSTRUCTIONS POST-ANESTHESIA  IMMEDIATELY FOLLOWING SURGERY:  Do not drive or operate machinery for the first twenty four hours after surgery.  Do not make any important decisions for twenty four hours after surgery or while taking narcotic pain medications or sedatives.  If you develop intractable nausea and vomiting or a severe headache please notify your doctor immediately.  FOLLOW-UP:  Please make an appointment  with your surgeon as instructed. You do not need to follow up with anesthesia unless specifically instructed to do so.  WOUND CARE INSTRUCTIONS (if applicable):  Keep a dry clean dressing on the anesthesia/puncture wound site if there is drainage.  Once the wound has quit draining you may leave it open to air.  Generally you should leave the bandage intact for twenty four hours unless there is drainage.  If the epidural site drains for more than 36-48 hours please call the anesthesia department.  QUESTIONS?:  Please feel free to call your physician or the hospital operator if you have any questions, and they will be happy to assist you.       +

## 2012-02-09 ENCOUNTER — Encounter (HOSPITAL_COMMUNITY): Payer: Self-pay | Admitting: Pharmacy Technician

## 2012-02-09 ENCOUNTER — Encounter (HOSPITAL_COMMUNITY)
Admission: RE | Admit: 2012-02-09 | Discharge: 2012-02-09 | Disposition: A | Discharge status: Home / Self Care | Payer: Medicaid Other | Source: Ambulatory Visit | Attending: Obstetrics & Gynecology | Admitting: Obstetrics & Gynecology

## 2012-02-09 ENCOUNTER — Encounter (HOSPITAL_COMMUNITY): Payer: Self-pay

## 2012-02-09 LAB — CBC WITH DIFFERENTIAL/PLATELET
Basophils Absolute: 0 10*3/uL (ref 0.0–0.1)
Basophils Relative: 0 % (ref 0–1)
Eosinophils Absolute: 0.1 10*3/uL (ref 0.0–0.7)
Hemoglobin: 12.6 g/dL (ref 12.0–15.0)
MCH: 28.4 pg (ref 26.0–34.0)
MCHC: 34.5 g/dL (ref 30.0–36.0)
Monocytes Relative: 8 % (ref 3–12)
Neutro Abs: 3.1 10*3/uL (ref 1.7–7.7)
Neutrophils Relative %: 62 % (ref 43–77)
Platelets: 243 10*3/uL (ref 150–400)

## 2012-02-09 LAB — SURGICAL PCR SCREEN
MRSA, PCR: POSITIVE — AB
Staphylococcus aureus: POSITIVE — AB

## 2012-02-09 LAB — COMPREHENSIVE METABOLIC PANEL
ALT: 16 U/L (ref 0–35)
AST: 14 U/L (ref 0–37)
Albumin: 3.4 g/dL — ABNORMAL LOW (ref 3.5–5.2)
Alkaline Phosphatase: 59 U/L (ref 39–117)
Chloride: 103 mEq/L (ref 96–112)
Potassium: 3.9 mEq/L (ref 3.5–5.1)
Sodium: 134 mEq/L — ABNORMAL LOW (ref 135–145)
Total Bilirubin: 0.5 mg/dL (ref 0.3–1.2)
Total Protein: 6.6 g/dL (ref 6.0–8.3)

## 2012-02-09 LAB — URINALYSIS, ROUTINE W REFLEX MICROSCOPIC
Glucose, UA: NEGATIVE mg/dL
Leukocytes, UA: NEGATIVE
Protein, ur: NEGATIVE mg/dL
Specific Gravity, Urine: 1.005 (ref 1.005–1.030)

## 2012-02-09 LAB — HCG, SERUM, QUALITATIVE: Preg, Serum: NEGATIVE

## 2012-02-09 LAB — TYPE AND SCREEN: ABO/RH(D): A POS

## 2012-02-09 LAB — URINE MICROSCOPIC-ADD ON

## 2012-02-15 ENCOUNTER — Encounter (HOSPITAL_COMMUNITY): Payer: Self-pay | Admitting: Anesthesiology

## 2012-02-15 ENCOUNTER — Inpatient Hospital Stay (HOSPITAL_COMMUNITY): Payer: Medicaid Other | Admitting: Anesthesiology

## 2012-02-15 ENCOUNTER — Encounter (HOSPITAL_COMMUNITY): Payer: Self-pay | Admitting: *Deleted

## 2012-02-15 ENCOUNTER — Encounter (HOSPITAL_COMMUNITY)
Admission: RE | Disposition: A | Discharge status: Home / Self Care | Payer: Self-pay | Source: Ambulatory Visit | Attending: Obstetrics & Gynecology

## 2012-02-15 ENCOUNTER — Inpatient Hospital Stay (HOSPITAL_COMMUNITY)
Admission: RE | Admit: 2012-02-15 | Discharge: 2012-02-16 | DRG: 742 | Disposition: A | Discharge status: Home / Self Care | Payer: Medicaid Other | Source: Ambulatory Visit | Attending: Obstetrics & Gynecology | Admitting: Obstetrics & Gynecology

## 2012-02-15 DIAGNOSIS — N809 Endometriosis, unspecified: Secondary | ICD-10-CM | POA: Diagnosis present

## 2012-02-15 DIAGNOSIS — Z90711 Acquired absence of uterus with remaining cervical stump: Secondary | ICD-10-CM

## 2012-02-15 DIAGNOSIS — N920 Excessive and frequent menstruation with regular cycle: Secondary | ICD-10-CM | POA: Diagnosis present

## 2012-02-15 DIAGNOSIS — D649 Anemia, unspecified: Secondary | ICD-10-CM | POA: Diagnosis present

## 2012-02-15 DIAGNOSIS — D259 Leiomyoma of uterus, unspecified: Principal | ICD-10-CM | POA: Diagnosis present

## 2012-02-15 DIAGNOSIS — Z6841 Body Mass Index (BMI) 40.0 and over, adult: Secondary | ICD-10-CM

## 2012-02-15 HISTORY — PX: SUPRACERVICAL ABDOMINAL HYSTERECTOMY: SHX5393

## 2012-02-15 SURGERY — HYSTERECTOMY, SUPRACERVICAL, ABDOMINAL
Anesthesia: General | Site: Abdomen | Wound class: Clean Contaminated

## 2012-02-15 MED ORDER — PROPOFOL 10 MG/ML IV BOLUS
INTRAVENOUS | Status: DC | PRN
  Administered 2012-02-15: 150 mg via INTRAVENOUS

## 2012-02-15 MED ORDER — MIDAZOLAM HCL 2 MG/2ML IJ SOLN
INTRAMUSCULAR | Status: AC
  Filled 2012-02-15: qty 2

## 2012-02-15 MED ORDER — ROCURONIUM BROMIDE 100 MG/10ML IV SOLN
INTRAVENOUS | Status: DC | PRN
  Administered 2012-02-15: 5 mg via INTRAVENOUS
  Administered 2012-02-15: 35 mg via INTRAVENOUS
  Administered 2012-02-15: 5 mg via INTRAVENOUS
  Administered 2012-02-15 (×2): 10 mg via INTRAVENOUS

## 2012-02-15 MED ORDER — FENTANYL CITRATE 0.05 MG/ML IJ SOLN
INTRAMUSCULAR | Status: AC
  Filled 2012-02-15: qty 2

## 2012-02-15 MED ORDER — LACTATED RINGERS IV SOLN
INTRAVENOUS | Status: DC
  Administered 2012-02-15 (×2): via INTRAVENOUS

## 2012-02-15 MED ORDER — LIDOCAINE HCL 1 % IJ SOLN
INTRAMUSCULAR | Status: DC | PRN
  Administered 2012-02-15: 40 mg via INTRADERMAL

## 2012-02-15 MED ORDER — KCL IN DEXTROSE-NACL 20-5-0.45 MEQ/L-%-% IV SOLN
INTRAVENOUS | Status: DC
  Administered 2012-02-15 – 2012-02-16 (×2): via INTRAVENOUS

## 2012-02-15 MED ORDER — FENTANYL CITRATE 0.05 MG/ML IJ SOLN
25.0000 ug | INTRAMUSCULAR | Status: DC | PRN
  Administered 2012-02-15 (×3): 50 ug via INTRAVENOUS
  Administered 2012-02-15 (×2): 25 ug via INTRAVENOUS

## 2012-02-15 MED ORDER — FENTANYL CITRATE 0.05 MG/ML IJ SOLN
INTRAMUSCULAR | Status: DC | PRN
  Administered 2012-02-15 (×7): 50 ug via INTRAVENOUS

## 2012-02-15 MED ORDER — SIMETHICONE 80 MG PO CHEW: 80.0000 mg | CHEWABLE_TABLET | Freq: Four times a day (QID) | ORAL | Status: DC | PRN

## 2012-02-15 MED ORDER — GLYCOPYRROLATE 0.2 MG/ML IJ SOLN
INTRAMUSCULAR | Status: AC
  Filled 2012-02-15: qty 2

## 2012-02-15 MED ORDER — OXYCODONE-ACETAMINOPHEN 5-325 MG PO TABS
1.0000 | ORAL_TABLET | ORAL | Status: DC | PRN
  Administered 2012-02-16 (×2): 2 via ORAL
  Administered 2012-02-16: 1 via ORAL
  Filled 2012-02-15 (×3): qty 2

## 2012-02-15 MED ORDER — ONDANSETRON 8 MG/NS 50 ML IVPB
8.0000 mg | Freq: Four times a day (QID) | INTRAVENOUS | Status: DC | PRN
  Administered 2012-02-15: 8 mg via INTRAVENOUS
  Filled 2012-02-15: qty 8

## 2012-02-15 MED ORDER — ONDANSETRON HCL 4 MG/2ML IJ SOLN
INTRAMUSCULAR | Status: AC
  Filled 2012-02-15: qty 2

## 2012-02-15 MED ORDER — DOCUSATE SODIUM 100 MG PO CAPS
100.0000 mg | ORAL_CAPSULE | Freq: Two times a day (BID) | ORAL | Status: DC
  Administered 2012-02-15 – 2012-02-16 (×3): 100 mg via ORAL
  Filled 2012-02-15 (×3): qty 1

## 2012-02-15 MED ORDER — PROPOFOL 10 MG/ML IV EMUL
INTRAVENOUS | Status: AC
  Filled 2012-02-15: qty 20

## 2012-02-15 MED ORDER — ONDANSETRON HCL 4 MG PO TABS
8.0000 mg | ORAL_TABLET | Freq: Four times a day (QID) | ORAL | Status: DC | PRN
  Administered 2012-02-15 – 2012-02-16 (×2): 8 mg via ORAL
  Filled 2012-02-15 (×3): qty 2

## 2012-02-15 MED ORDER — HYDROMORPHONE HCL PF 1 MG/ML IJ SOLN
1.0000 mg | INTRAMUSCULAR | Status: DC | PRN
  Administered 2012-02-15 (×2): 1 mg via INTRAVENOUS
  Administered 2012-02-15: 2 mg via INTRAVENOUS
  Filled 2012-02-15: qty 2
  Filled 2012-02-15: qty 1
  Filled 2012-02-15: qty 2

## 2012-02-15 MED ORDER — ONDANSETRON HCL 4 MG/2ML IJ SOLN: 4.0000 mg | Freq: Once | INTRAMUSCULAR | Status: DC | PRN

## 2012-02-15 MED ORDER — ONDANSETRON HCL 4 MG/2ML IJ SOLN
4.0000 mg | Freq: Once | INTRAMUSCULAR | Status: AC
  Administered 2012-02-15: 4 mg via INTRAVENOUS

## 2012-02-15 MED ORDER — NEOSTIGMINE METHYLSULFATE 1 MG/ML IJ SOLN
INTRAMUSCULAR | Status: DC | PRN
  Administered 2012-02-15: 3 mg via INTRAVENOUS

## 2012-02-15 MED ORDER — MIDAZOLAM HCL 2 MG/2ML IJ SOLN
1.0000 mg | INTRAMUSCULAR | Status: DC | PRN
  Administered 2012-02-15: 2 mg via INTRAVENOUS

## 2012-02-15 MED ORDER — LIDOCAINE HCL (PF) 1 % IJ SOLN
INTRAMUSCULAR | Status: AC
  Filled 2012-02-15: qty 5

## 2012-02-15 MED ORDER — ZOLPIDEM TARTRATE 5 MG PO TABS: 10.0000 mg | ORAL_TABLET | Freq: Every evening | ORAL | Status: DC | PRN

## 2012-02-15 MED ORDER — NEOSTIGMINE METHYLSULFATE 1 MG/ML IJ SOLN
INTRAMUSCULAR | Status: AC
  Filled 2012-02-15: qty 10

## 2012-02-15 MED ORDER — MENTHOL 3 MG MT LOZG: 1.0000 | LOZENGE | OROMUCOSAL | Status: DC | PRN

## 2012-02-15 MED ORDER — MUPIROCIN 2 % EX OINT
TOPICAL_OINTMENT | Freq: Two times a day (BID) | CUTANEOUS | Status: DC
  Administered 2012-02-15 – 2012-02-16 (×2): via NASAL
  Filled 2012-02-15: qty 22

## 2012-02-15 MED ORDER — CEFAZOLIN SODIUM-DEXTROSE 2-3 GM-% IV SOLR
2.0000 g | INTRAVENOUS | Status: AC
  Administered 2012-02-15: 2 g via INTRAVENOUS

## 2012-02-15 MED ORDER — ALUM & MAG HYDROXIDE-SIMETH 200-200-20 MG/5ML PO SUSP: 30.0000 mL | ORAL | Status: DC | PRN

## 2012-02-15 MED ORDER — ROCURONIUM BROMIDE 50 MG/5ML IV SOLN
INTRAVENOUS | Status: AC
  Filled 2012-02-15: qty 1

## 2012-02-15 MED ORDER — CEFAZOLIN SODIUM-DEXTROSE 2-3 GM-% IV SOLR
INTRAVENOUS | Status: AC
  Filled 2012-02-15: qty 50

## 2012-02-15 MED ORDER — SODIUM CHLORIDE 0.9 % IR SOLN
Status: DC | PRN
  Administered 2012-02-15 (×4): 1000 mL

## 2012-02-15 MED ORDER — FENTANYL CITRATE 0.05 MG/ML IJ SOLN
INTRAMUSCULAR | Status: AC
  Filled 2012-02-15: qty 5

## 2012-02-15 MED ORDER — GLYCOPYRROLATE 0.2 MG/ML IJ SOLN
INTRAMUSCULAR | Status: DC | PRN
  Administered 2012-02-15: .6 mg via INTRAVENOUS

## 2012-02-15 SURGICAL SUPPLY — 51 items
APPLIER CLIP 13 LRG OPEN (CLIP) ×2
APR CLP LRG 13 20 CLIP (CLIP) ×1
BAG HAMPER (MISCELLANEOUS) ×2 IMPLANT
CELLS DAT CNTRL 66122 CELL SVR (MISCELLANEOUS) IMPLANT
CLIP APPLIE 13 LRG OPEN (CLIP) ×1 IMPLANT
CLOTH BEACON ORANGE TIMEOUT ST (SAFETY) ×2 IMPLANT
COVER LIGHT HANDLE STERIS (MISCELLANEOUS) ×4 IMPLANT
DERMABOND ADVANCED (GAUZE/BANDAGES/DRESSINGS)
DERMABOND ADVANCED .7 DNX12 (GAUZE/BANDAGES/DRESSINGS) IMPLANT
DRAPE WARM FLUID 44X44 (DRAPE) ×2 IMPLANT
DRESSING TELFA 8X3 (GAUZE/BANDAGES/DRESSINGS) ×2 IMPLANT
ELECT REM PT RETURN 9FT ADLT (ELECTROSURGICAL) ×2
ELECTRODE REM PT RTRN 9FT ADLT (ELECTROSURGICAL) ×1 IMPLANT
FORMALIN 10 PREFIL 480ML (MISCELLANEOUS) IMPLANT
GLOVE BIOGEL PI IND STRL 7.0 (GLOVE) ×3 IMPLANT
GLOVE BIOGEL PI IND STRL 8 (GLOVE) ×1 IMPLANT
GLOVE BIOGEL PI INDICATOR 7.0 (GLOVE) ×3
GLOVE BIOGEL PI INDICATOR 8 (GLOVE) ×1
GLOVE ECLIPSE 6.5 STRL STRAW (GLOVE) ×4 IMPLANT
GLOVE ECLIPSE 8.0 STRL XLNG CF (GLOVE) ×2 IMPLANT
GLOVE EXAM NITRILE MD LF STRL (GLOVE) ×6 IMPLANT
GOWN SRG XL XLNG 56XLVL 4 (GOWN DISPOSABLE) ×1 IMPLANT
GOWN STRL NON-REIN XL XLG LVL4 (GOWN DISPOSABLE) ×2
GOWN STRL REIN XL XLG (GOWN DISPOSABLE) ×6 IMPLANT
INST SET MAJOR GENERAL (KITS) ×2 IMPLANT
KIT ROOM TURNOVER APOR (KITS) ×2 IMPLANT
MANIFOLD NEPTUNE II (INSTRUMENTS) ×2 IMPLANT
NS IRRIG 1000ML POUR BTL (IV SOLUTION) ×8 IMPLANT
PACK ABDOMINAL MAJOR (CUSTOM PROCEDURE TRAY) ×2 IMPLANT
PAD ABD 5X9 TENDERSORB (GAUZE/BANDAGES/DRESSINGS) ×2 IMPLANT
PAD ARMBOARD 7.5X6 YLW CONV (MISCELLANEOUS) ×2 IMPLANT
RETRACTOR WND ALEXIS 25 LRG (MISCELLANEOUS) ×1 IMPLANT
RTRCTR WOUND ALEXIS 18CM MED (MISCELLANEOUS)
RTRCTR WOUND ALEXIS 25CM LRG (MISCELLANEOUS) ×2
SEPRAFILM MEMBRANE 5X6 (MISCELLANEOUS) IMPLANT
SET BASIN LINEN APH (SET/KITS/TRAYS/PACK) ×2 IMPLANT
SPONGE GAUZE 4X4 12PLY (GAUZE/BANDAGES/DRESSINGS) ×2 IMPLANT
SPONGE LAP 18X18 X RAY DECT (DISPOSABLE) ×4 IMPLANT
STAPLER VISISTAT 35W (STAPLE) ×2 IMPLANT
SUT CHROMIC 0 CT 1 (SUTURE) ×2 IMPLANT
SUT MON AB 3-0 SH 27 (SUTURE) IMPLANT
SUT PLAIN 2 0 XLH (SUTURE) ×2 IMPLANT
SUT VIC AB 0 CT1 27 (SUTURE) ×10
SUT VIC AB 0 CT1 27XBRD ANTBC (SUTURE) ×1 IMPLANT
SUT VIC AB 0 CT1 27XCR 8 STRN (SUTURE) ×4 IMPLANT
SUT VIC AB 0 CTX 36 (SUTURE) ×2
SUT VIC AB 0 CTX36XBRD ANTBCTR (SUTURE) ×1 IMPLANT
SUT VICRYL 3 0 (SUTURE) IMPLANT
TAPE CLOTH SURG 4X10 WHT LF (GAUZE/BANDAGES/DRESSINGS) ×2 IMPLANT
TOWEL BLUE STERILE X RAY DET (MISCELLANEOUS) IMPLANT
TRAY FOLEY CATH 14FR (SET/KITS/TRAYS/PACK) ×2 IMPLANT

## 2012-02-15 NOTE — Anesthesia Postprocedure Evaluation (Addendum)
  Anesthesia Post-op Note  Patient: Elaine [name]  Procedure(s) Performed: Procedure(s) (LRB): HYSTERECTOMY SUPRACERVICAL ABDOMINAL (N/A)  Patient Location: PACU  Anesthesia Type: General  Level of Consciousness: awake, alert  and oriented  Airway and Oxygen Therapy: Patient Spontanous Breathing and Patient connected to face mask oxygen  Post-op Pain: mild  Post-op Assessment: Post-op Vital signs reviewed, Patient's Cardiovascular Status Stable, Respiratory Function Stable, Patent Airway and No signs of Nausea or vomiting  Post-op Vital Signs: Reviewed and stable  Complications: No apparent anesthesia complications  02/16/12  Patient discharged home today.  No apparent anesthesia complications.

## 2012-02-15 NOTE — H&P (Signed)
Elaine [name] is an 46 y.o. female G6 P 6 with known enlarged fibroids, 16 weeks size, with heavy menstrual bleeding and severe pain.  Her hemoglobin last year was 10.  We placed her on Megace and her hemoglobin is now up to 13.  As a result we will proceed with an abdominal supracervical hysterectomy with preservation of her ovaries if they are normal.   OB History    Grav Para Term Preterm Abortions TAB SAB Ect Mult Living   6 6 6  0 0 0 0 0 0 6     Patient's last menstrual period was 02/14/2012.    Past Medical History  Diagnosis Date  . Anemia   . Fibroids   . Endometriosis     Past Surgical History  Procedure Date  . Tonsillectomy     child  . Laparoscopic tubal ligation 1995  . Tubal ligation   . Tonsillectomy   . Laparoscopic cholecystectomy 09/2010    [facility]    Family History  Problem Relation Age of Onset  . Heart attack Father     Social History:  reports that she has never smoked. She does not have any smokeless tobacco history on file. She reports that she does not drink alcohol or use illicit drugs.  Allergies:  Allergies  Allergen Reactions  . Hydrocodone Hives, Itching and Nausea Only    Prescriptions prior to admission  Medication Sig Dispense Refill  . ibuprofen (ADVIL,MOTRIN) 200 MG tablet Take 800 mg by mouth every 8 (eight) hours as needed. For pain      . megestrol (MEGACE) 40 MG tablet Take 3 tablets for 5 days, then take 2 tablets for 5 days, then start taking one tablet daily        ROS  Review of Systems  Constitutional: Negative for fever, chills, weight loss, malaise/fatigue and diaphoresis.  HENT: Negative for hearing loss, ear pain, nosebleeds, congestion, sore throat, neck pain, tinnitus and ear discharge.   Eyes: Negative for blurred vision, double vision, photophobia, pain, discharge and redness.  Respiratory: Negative for cough, hemoptysis, sputum production, shortness of breath, wheezing and stridor.   Cardiovascular:  Negative for chest pain, palpitations, orthopnea, claudication, leg swelling and PND.  Gastrointestinal: Positive for abdominal pain. Negative for heartburn, nausea, vomiting, diarrhea, constipation, blood in stool and melena.  Genitourinary: Negative for dysuria, urgency, frequency, hematuria and flank pain.  Musculoskeletal: Negative for myalgias, back pain, joint pain and falls.  Skin: Negative for itching and rash.  Neurological: Negative for dizziness, tingling, tremors, sensory change, speech change, focal weakness, seizures, loss of consciousness, weakness and headaches.  Endo/Heme/Allergies: Negative for environmental allergies and polydipsia. Does not bruise/bleed easily.  Psychiatric/Behavioral: Negative for depression, suicidal ideas, hallucinations, memory loss and substance abuse. The patient is not nervous/anxious and does not have insomnia.      Blood pressure 157/90, pulse 82, temperature 98.1 F (36.7 C), temperature source Oral, resp. rate 16, last menstrual period 02/14/2012, SpO2 100.00%. Physical Exam Physical Exam  Vitals reviewed. Constitutional: She is oriented to person, place, and time. She appears well-developed and well-nourished.  HENT:  Head: Normocephalic and atraumatic.  Right Ear: External ear normal.  Left Ear: External ear normal.  Nose: Nose normal.  Mouth/Throat: Oropharynx is clear and moist.  Eyes: Conjunctivae and EOM are normal. Pupils are equal, round, and reactive to light. Right eye exhibits no discharge. Left eye exhibits no discharge. No scleral icterus.  Neck: Normal range of motion. Neck supple. No tracheal deviation present.  No thyromegaly present.  Cardiovascular: Normal rate, regular rhythm, normal heart sounds and intact distal pulses.  Exam reveals no gallop and no friction rub.   No murmur heard. Respiratory: Effort normal and breath sounds normal. No respiratory distress. She has no wheezes. She has no rales. She exhibits no  tenderness.  GI: Soft. Bowel sounds are normal. She exhibits no distension and no mass. There is tenderness. There is no rebound and no guarding.  Genitourinary:       Vulva is normal without lesions Vagina is pink moist without discharge Cervix normal in appearance and pap is normal Uterus is enlarged to 16-18 weeks size due to multiple fibroids the largest of which is 6.1 cm Adnexa is negative with normal sized ovaries by sonogram  Musculoskeletal: Normal range of motion. She exhibits no edema and no tenderness.  Neurological: She is alert and oriented to person, place, and time. She has normal reflexes. She displays normal reflexes. No cranial nerve deficit. She exhibits normal muscle tone. Coordination normal.  Skin: Skin is warm and dry. No rash noted. No erythema. No pallor.  Psychiatric: She has a normal mood and affect. Her behavior is normal. Judgment and thought content normal.   Recent Results (from the past 336 hour(s))  TYPE AND SCREEN   Collection Time   02/09/12  9:25 AM      Component Value Range   ABO/RH(D) A POS     Antibody Screen NEG     Sample Expiration 02/23/2012    SURGICAL PCR SCREEN   Collection Time   02/09/12  9:45 AM      Component Value Range   MRSA, PCR POSITIVE (*) NEGATIVE   Staphylococcus aureus POSITIVE (*) NEGATIVE  HCG, SERUM, QUALITATIVE   Collection Time   02/09/12  9:45 AM      Component Value Range   Preg, Serum NEGATIVE  NEGATIVE  CBC WITH DIFFERENTIAL   Collection Time   02/09/12  9:45 AM      Component Value Range   WBC 4.9  4.0 - 10.5 K/uL   RBC 4.43  3.87 - 5.11 MIL/uL   Hemoglobin 12.6  12.0 - 15.0 g/dL   HCT 16.1  09.6 - 04.5 %   MCV 82.4  78.0 - 100.0 fL   MCH 28.4  26.0 - 34.0 pg   MCHC 34.5  30.0 - 36.0 g/dL   RDW 40.9  81.1 - 91.4 %   Platelets 243  150 - 400 K/uL   Neutrophils Relative 62  43 - 77 %   Neutro Abs 3.1  1.7 - 7.7 K/uL   Lymphocytes Relative 27  12 - 46 %   Lymphs Abs 1.4  0.7 - 4.0 K/uL   Monocytes  Relative 8  3 - 12 %   Monocytes Absolute 0.4  0.1 - 1.0 K/uL   Eosinophils Relative 2  0 - 5 %   Eosinophils Absolute 0.1  0.0 - 0.7 K/uL   Basophils Relative 0  0 - 1 %   Basophils Absolute 0.0  0.0 - 0.1 K/uL  COMPREHENSIVE METABOLIC PANEL   Collection Time   02/09/12  9:45 AM      Component Value Range   Sodium 134 (*) 135 - 145 mEq/L   Potassium 3.9  3.5 - 5.1 mEq/L   Chloride 103  96 - 112 mEq/L   CO2 25  19 - 32 mEq/L   Glucose, Bld 100 (*) 70 - 99 mg/dL   BUN 10  6 - 23 mg/dL   Creatinine, Ser 1.91  0.50 - 1.10 mg/dL   Calcium 9.0  8.4 - 47.8 mg/dL   Total Protein 6.6  6.0 - 8.3 g/dL   Albumin 3.4 (*) 3.5 - 5.2 g/dL   AST 14  0 - 37 U/L   ALT 16  0 - 35 U/L   Alkaline Phosphatase 59  39 - 117 U/L   Total Bilirubin 0.5  0.3 - 1.2 mg/dL   GFR calc non Af Amer >90  >90 mL/min   GFR calc Af Amer >90  >90 mL/min  URINALYSIS, ROUTINE W REFLEX MICROSCOPIC   Collection Time   02/09/12  9:45 AM      Component Value Range   Color, Urine YELLOW  YELLOW   APPearance CLEAR  CLEAR   Specific Gravity, Urine 1.005  1.005 - 1.030   pH 6.0  5.0 - 8.0   Glucose, UA NEGATIVE  NEGATIVE mg/dL   Hgb urine dipstick LARGE (*) NEGATIVE   Bilirubin Urine NEGATIVE  NEGATIVE   Ketones, ur NEGATIVE  NEGATIVE mg/dL   Protein, ur NEGATIVE  NEGATIVE mg/dL   Urobilinogen, UA 0.2  0.0 - 1.0 mg/dL   Nitrite NEGATIVE  NEGATIVE   Leukocytes, UA NEGATIVE  NEGATIVE  URINE MICROSCOPIC-ADD ON   Collection Time   02/09/12  9:45 AM      Component Value Range   RBC / HPF 7-10  <3 RBC/hpf     Assessment/Plan: 1.  16 week size fibroid uterus 2.  Menometrorrhagia, maintained on megestrol 3.  Anemia, resolved with megestrol  Proceed with abdominal supracervical hysterectomy, plan to preserve ovaries Pt understands the risks of surgery including but not limited t  excessive bleeding requiring transfusion or reoperation, post-operative infection requiring prolonged hospitalization or re-hospitalization and  antibiotic therapy, and damage to other organs including bladder, bowel, ureters and major vessels.  The patient also understands the alternative treatment options which were discussed in full.  All questions were answered.   Ervan Heber H 02/15/2012, 7:31 AM

## 2012-02-15 NOTE — Progress Notes (Signed)
UR Chart Review Completed  

## 2012-02-15 NOTE — Anesthesia Preprocedure Evaluation (Addendum)
Anesthesia Evaluation  Patient identified by MRN, date of birth, ID band Patient awake    Reviewed: Allergy & Precautions, H&P , NPO status , Patient's Chart, lab work & pertinent test results  History of Anesthesia Complications Negative for: history of anesthetic complications  Airway Mallampati: I      Dental  (+) Teeth Intact   Pulmonary neg pulmonary ROS,  breath sounds clear to auscultation        Cardiovascular negative cardio ROS  Rhythm:Regular     Neuro/Psych Anxiety    GI/Hepatic GERD-  Medicated and Controlled,  Endo/Other    Renal/GU      Musculoskeletal   Abdominal   Peds  Hematology   Anesthesia Other Findings   Reproductive/Obstetrics                           Anesthesia Physical Anesthesia Plan  ASA: II  Anesthesia Plan: General   Post-op Pain Management:    Induction: Intravenous, Rapid sequence and Cricoid pressure planned  Airway Management Planned: Oral ETT  Additional Equipment:   Intra-op Plan:   Post-operative Plan: Extubation in OR  Informed Consent: I have reviewed the patients History and Physical, chart, labs and discussed the procedure including the risks, benefits and alternatives for the proposed anesthesia with the patient or authorized representative who has indicated his/her understanding and acceptance.     Plan Discussed with:   Anesthesia Plan Comments:         Anesthesia Quick Evaluation

## 2012-02-15 NOTE — Transfer of Care (Signed)
Immediate Anesthesia Transfer of Care Note  Patient: Elaine [name]  Procedure(s) Performed: Procedure(s) (LRB): HYSTERECTOMY SUPRACERVICAL ABDOMINAL (N/A)  Patient Location: PACU  Anesthesia Type: General  Level of Consciousness: awake  Airway & Oxygen Therapy: Patient Spontanous Breathing and Patient connected to face mask oxygen  Post-op Assessment: Report given to PACU RN  Post vital signs: Reviewed and stable  Complications: No apparent anesthesia complications

## 2012-02-15 NOTE — Op Note (Signed)
Preoperative diagnosis:  1.  Enlarged fibroid uterus, 16 weeks size                                          2.  Menometrorrhagia                                         3.  Anemia, resolved with megestrol therapy                                          Postoperative diagnosis:  Same as above   Procedure:  Abdominal hysterectomy, supracervical  Surgeon:  Lazaro Arms  Assistant:    Anesthesia:  General endotracheal  Preoperative clinical summary:  Patient seen initially last year with heavy vaginal bleeding, fibroids and hemoglobin of 10.  I placed her on megestrol therapy and iron and now she has hemoglobin of 13.  Her bleeding has been contained on megace but off it is quite heavy and painful  Intraoperative findings: 16 weeks size fibroid uterus, normal ovaries, very hypervascular parametrial space  Description of operation:  Patient was taken to the operating room and placed in the supine position where she underwent general endotracheal anesthesia.  She was then prepped and draped in the usual sterile fashion and a Foley catheter was placed for continuous bladder drainage.  A Pfannenstiel skin incision was made and carried down sharply to the rectus fascia which was scored in the midline and extended laterally.  The fascia was taken off the muscles superiorly and inferiorly without difficulty.  The muscles were divided.  The peritoneal cavity was entered.  A large Alexis self-retaining retractor was placed.  The upper abdomen was packed away. Both uterine cornu were grasped with Coker clamps.  The left round ligament was suture ligated and coagulated with the electrocautery unit.  The left vesicouterine serosal flap was created.  An avascular window in in the peritoneum was created and the utero-ovarian ligament was cross clamped, cut and suture ligated.  The right round ligament was suture ligated and cut with the electrocautery unit.  The vesicouterine serosal flap on the right was  created.  An avascular window in the peritoneum was created and the right utero-ovarian ligament was cross clamped, cut and double suture ligated.  Thus both ovaries were preserved. The bladder reflection was taken down without difficulty.   The uterine vessels were skeletonized bilaterally.  The uterine vessels were clamped bilaterally,  then cut and suture ligated.  Two more pedicles were taken down the cervix medial to the uterine vessels.  Each pedicle was clamped cut and suture ligated with good resulting hemostasis.  As per the preoperative plan the cervix was then transected sharply and the specimen was removed.  The endocervix was cored out and the cervical stump was then closed anterior to posterior for hemostasis and reduce postoperative adhesions.  The pelvis was irrigated vigorously and all pedicles were examined and found to be hemostatic.    All specimens were sent to pathology for routine evaluation.  The Alexis self-retaining retractor was removed and the pelvis was irrigated vigorously.  All packs were removed and all counts were correct at this point x 3.  The muscles and peritoneum were reapproximated loosely.  The fascia was closed with 0 Vicryl running.  The subcutaneous tissue was reapproximated using 2-0 plain gut.  The skin was closed using skin staples.    The patient was awakened from anesthesia taken to the recovery room in good stable condition. All sponge instrument and needle counts were correct x 3.  The patient received Ancef 2 grams prophylactically preoperatively.  Estimated blood loss for the procedure was 150  cc.  Avarose Mervine H 02/15/2012 9:56 AM

## 2012-02-16 LAB — CBC
HCT: 33.6 % — ABNORMAL LOW (ref 36.0–46.0)
Hemoglobin: 11.7 g/dL — ABNORMAL LOW (ref 12.0–15.0)
MCHC: 34.8 g/dL (ref 30.0–36.0)
RBC: 4.07 MIL/uL (ref 3.87–5.11)

## 2012-02-16 LAB — BASIC METABOLIC PANEL
BUN: 5 mg/dL — ABNORMAL LOW (ref 6–23)
CO2: 24 mEq/L (ref 19–32)
GFR calc non Af Amer: >90 mL/min (ref >90)
Glucose, Bld: 160 mg/dL — ABNORMAL HIGH (ref 70–99)
Potassium: 3.6 mEq/L (ref 3.5–5.1)
Sodium: 131 mEq/L — ABNORMAL LOW (ref 135–145)

## 2012-02-16 MED ORDER — KETOROLAC TROMETHAMINE 10 MG PO TABS: 10.0000 mg | ORAL_TABLET | Freq: Three times a day (TID) | ORAL | Status: AC | PRN

## 2012-02-16 MED ORDER — ONDANSETRON HCL 8 MG PO TABS: 8.0000 mg | ORAL_TABLET | Freq: Four times a day (QID) | ORAL | Status: AC | PRN

## 2012-02-16 MED ORDER — OXYCODONE-ACETAMINOPHEN 5-325 MG PO TABS: 1.0000 | ORAL_TABLET | ORAL | Status: AC | PRN

## 2012-02-16 NOTE — Progress Notes (Signed)
UR chart review completed.  

## 2012-02-16 NOTE — Discharge Summary (Addendum)
Physician Discharge Summary  Patient ID: Elaine [name] MRN: [id number] DOB/AGE: [id number] y.o.  Admit date: 02/15/2012 Discharge date: 02/16/2012  Admission Diagnoses: Fibroids, menometrorrhagia, morbidly obesity Discharge Diagnoses:  Same as above and Status post abdominal supracervical hysterectomy  Discharged Condition: good  Hospital Course: unremarkable  Consults: None  Significant Diagnostic Studies: labs  Treatments: surgery: abdominal supracervical hysterectomy  Discharge Exam: Blood pressure 130/77, pulse 66, temperature 98.6 F (37 C), temperature source Oral, resp. rate 18, height 5\' 1"  (1.549 m), weight 108.863 kg (240 lb), last menstrual period 02/14/2012, SpO2 97.00%. General appearance: alert, cooperative and no distress GI: soft, non-tender; bowel sounds normal; no masses,  no organomegaly and incision clean dry intact  Disposition: 01-Home or Self Care  Discharge Orders    Future Orders Please Complete By Expires   Diet - low sodium heart healthy      Increase activity slowly      Driving Restrictions      Comments:   None until released by Dr Despina Hidden   Lifting restrictions      Comments:   None til released by Dr Despina Hidden   Sexual Activity Restrictions      Comments:   Yeah right   Discharge wound care:      Comments:   Keep the incision clean and dry   Call MD for:  temperature >100.4      Call MD for:  persistant nausea and vomiting      Call MD for:  severe uncontrolled pain        Medication List  As of 02/16/2012  9:26 AM   STOP taking these medications         ibuprofen 200 MG tablet      megestrol 40 MG tablet         TAKE these medications         ketorolac 10 MG tablet   Commonly known as: TORADOL   Take 1 tablet (10 mg total) by mouth every 8 (eight) hours as needed for pain.      ondansetron 8 MG tablet   Commonly known as: ZOFRAN   Take 1 tablet (8 mg total) by mouth every 6 (six) hours as needed for nausea.     oxyCODONE-acetaminophen 5-325 MG per tablet   Commonly known as: PERCOCET/ROXICET   Take 1-2 tablets by mouth every 4 (four) hours as needed (moderate to severe pain (when tolerating fluids)).           Follow-up Information    Follow up with Lazaro Arms, MD in 1 week. (already scheduled)    Contact information:   [facility] [address] [place] [address] [phone]          Signed: Lazaro Arms 02/16/2012, 9:26 AM

## 2012-02-16 NOTE — Addendum Note (Signed)
Addendum  created 02/16/12 1332 by Moshe Salisbury, CRNA   Modules edited:Notes Section

## 2012-02-16 NOTE — Progress Notes (Signed)
Discharge instructions given to pt. With teach back given to RN. Pt. Taken to car via W/C. 

## 2012-02-20 ENCOUNTER — Encounter (HOSPITAL_COMMUNITY): Payer: Self-pay | Admitting: Obstetrics & Gynecology

## 2012-12-17 ENCOUNTER — Encounter (HOSPITAL_COMMUNITY): Payer: Self-pay

## 2012-12-17 ENCOUNTER — Emergency Department (HOSPITAL_COMMUNITY)
Admission: EM | Admit: 2012-12-17 | Discharge: 2012-12-17 | Disposition: A | Discharge status: Home / Self Care | Payer: Self-pay | Attending: Emergency Medicine | Admitting: Emergency Medicine

## 2012-12-17 ENCOUNTER — Emergency Department (HOSPITAL_COMMUNITY): Payer: Self-pay

## 2012-12-17 DIAGNOSIS — R300 Dysuria: Secondary | ICD-10-CM | POA: Insufficient documentation

## 2012-12-17 DIAGNOSIS — Z862 Personal history of diseases of the blood and blood-forming organs and certain disorders involving the immune mechanism: Secondary | ICD-10-CM | POA: Insufficient documentation

## 2012-12-17 DIAGNOSIS — R110 Nausea: Secondary | ICD-10-CM | POA: Insufficient documentation

## 2012-12-17 DIAGNOSIS — R109 Unspecified abdominal pain: Secondary | ICD-10-CM | POA: Insufficient documentation

## 2012-12-17 DIAGNOSIS — R350 Frequency of micturition: Secondary | ICD-10-CM | POA: Insufficient documentation

## 2012-12-17 DIAGNOSIS — Z9089 Acquired absence of other organs: Secondary | ICD-10-CM | POA: Insufficient documentation

## 2012-12-17 DIAGNOSIS — R34 Anuria and oliguria: Secondary | ICD-10-CM | POA: Insufficient documentation

## 2012-12-17 DIAGNOSIS — Z90710 Acquired absence of both cervix and uterus: Secondary | ICD-10-CM | POA: Insufficient documentation

## 2012-12-17 DIAGNOSIS — N390 Urinary tract infection, site not specified: Secondary | ICD-10-CM | POA: Insufficient documentation

## 2012-12-17 DIAGNOSIS — Z8742 Personal history of other diseases of the female genital tract: Secondary | ICD-10-CM | POA: Insufficient documentation

## 2012-12-17 DIAGNOSIS — N760 Acute vaginitis: Secondary | ICD-10-CM | POA: Insufficient documentation

## 2012-12-17 DIAGNOSIS — Z9851 Tubal ligation status: Secondary | ICD-10-CM | POA: Insufficient documentation

## 2012-12-17 LAB — URINALYSIS, ROUTINE W REFLEX MICROSCOPIC
Bilirubin Urine: NEGATIVE
Glucose, UA: NEGATIVE mg/dL
Hgb urine dipstick: NEGATIVE
Nitrite: NEGATIVE
Specific Gravity, Urine: 1.015 (ref 1.005–1.030)
pH: 5.5 (ref 5.0–8.0)

## 2012-12-17 LAB — WET PREP, GENITAL

## 2012-12-17 MED ORDER — METRONIDAZOLE 500 MG PO TABS
2000.0000 mg | ORAL_TABLET | Freq: Once | ORAL | Status: AC
  Administered 2012-12-17: 2000 mg via ORAL
  Filled 2012-12-17: qty 4

## 2012-12-17 NOTE — ED Provider Notes (Signed)
History     This chart was scribed for Hilario Quarry, MD, MD by Smitty Pluck, ED Scribe. The patient was seen in room APA03[facility] and the patient's care was started at 10:30 AM   CSN: [id number]  Arrival date & time 12/17/12  5621      Chief Complaint  Patient presents with  . Urinary Tract Infection     Patient is a 47 y.o. female presenting with urinary tract infection. The history is provided by the patient and medical records. No language interpreter was used.  Urinary Tract Infection This is a new problem. The current episode started more than 1 week ago. The problem occurs constantly. The problem has not changed since onset.Associated symptoms include abdominal pain. Nothing aggravates the symptoms. Nothing relieves the symptoms.   HPI Comments: Elaine [name] is a 47 y.o. female who presents to the Emergency Department complaining of intermittent, moderate lower back pain radiating to RLQ and difficulty urinating onset 2 weeks ago. Pt reports having associated difficulty urinating, dysuria and frequency. Pt states that she has nausea that has been ongoing for past week. She states she was seen in [facility] Dept diagnosed with UTI and treated with Cipro but did have relief. Pt denies vaginal discharge, hematuria, fever, chills, vomiting, diarrhea, weakness, cough, SOB and any other pain. She denies hx of STD infections. She reports hx of hysterectomy.    Past Medical History  Diagnosis Date  . Anemia   . Fibroids   . Endometriosis     Past Surgical History  Procedure Laterality Date  . Tonsillectomy      child  . Laparoscopic tubal ligation  1995  . Tubal ligation    . Tonsillectomy    . Laparoscopic cholecystectomy  09/2010    [facility]  . Supraclavical abdominal hysterectomy  02/15/2012    Procedure: HYSTERECTOMY SUPRACERVICAL ABDOMINAL;  Surgeon: Lazaro Arms, MD;  Location: [facility];  Service: Gynecology;  Laterality: N/A;  . Abdominal hysterectomy       Family History  Problem Relation Age of Onset  . Heart attack Father     History  Substance Use Topics  . Smoking status: Never Smoker   . Smokeless tobacco: Not on file  . Alcohol Use: No    OB History   Grav Para Term Preterm Abortions TAB SAB Ect Mult Living   6 6 6  0 0 0 0 0 0 6      Review of Systems  Constitutional: Negative for fever and chills.  Gastrointestinal: Positive for abdominal pain.  Genitourinary: Positive for dysuria, decreased urine volume and difficulty urinating. Negative for vaginal discharge.  Musculoskeletal: Positive for back pain.  All other systems reviewed and are negative.    Allergies  Hydrocodone  Home Medications   Current Outpatient Rx  Name  Route  Sig  Dispense  Refill  . ibuprofen (ADVIL,MOTRIN) 200 MG tablet   Oral   Take 200 mg by mouth every 6 (six) hours as needed for pain.         Marland Kitchen Phenazopyridine HCl (AZO TABS PO)   Oral   Take 1 tablet by mouth 2 (two) times daily.           BP 143/77  Pulse 65  Temp(Src) 98.2 F (36.8 C) (Oral)  Resp 20  SpO2 100%  LMP 01/25/2012  Physical Exam  Nursing note and vitals reviewed. Constitutional: She is oriented to person, place, and time. She appears well-developed and well-nourished. No distress.  HENT:  Head: Normocephalic and atraumatic.  Mouth/Throat: Oropharynx is clear and moist.  Eyes: Conjunctivae are normal. Pupils are equal, round, and reactive to light.  Neck: Normal range of motion. Neck supple. No JVD present. No tracheal deviation present.  Cardiovascular: Normal rate, regular rhythm and normal heart sounds.   No murmur heard. Pulmonary/Chest: Effort normal and breath sounds normal. No respiratory distress. She has no wheezes. She has no rales.  Abdominal: Soft. Bowel sounds are normal. She exhibits no distension. There is no tenderness. There is no rebound and no guarding.  Musculoskeletal: Normal range of motion.  Lymphadenopathy:    She has no  cervical adenopathy.  Neurological: She is alert and oriented to person, place, and time. No cranial nerve deficit.  Skin: Skin is warm and dry.  Psychiatric: She has a normal mood and affect. Her behavior is normal. Judgment and thought content normal.    ED Course  Procedures (including critical care time) DIAGNOSTIC STUDIES: Oxygen Saturation is 100% on room air, normal by my interpretation.    COORDINATION OF CARE: 10:37 AM Discussed ED treatment with pt and pt agrees.  Results for orders placed during the hospital encounter of 12/17/12  URINALYSIS, ROUTINE W REFLEX MICROSCOPIC      Result Value Range   Color, Urine YELLOW  YELLOW   APPearance CLEAR  CLEAR   Specific Gravity, Urine 1.015  1.005 - 1.030   pH 5.5  5.0 - 8.0   Glucose, UA NEGATIVE  NEGATIVE mg/dL   Hgb urine dipstick NEGATIVE  NEGATIVE   Bilirubin Urine NEGATIVE  NEGATIVE   Ketones, ur NEGATIVE  NEGATIVE mg/dL   Protein, ur NEGATIVE  NEGATIVE mg/dL   Urobilinogen, UA 0.2  0.0 - 1.0 mg/dL   Nitrite NEGATIVE  NEGATIVE   Leukocytes, UA NEGATIVE  NEGATIVE      Ct Abdomen Pelvis Wo Contrast  12/17/2012   *RADIOLOGY REPORT*  Clinical Data: Right flank pain.  CT ABDOMEN AND PELVIS WITHOUT CONTRAST  Technique:  Multidetector CT imaging of the abdomen and pelvis was performed following the standard protocol without intravenous contrast.  Comparison: None.  Findings: The lung bases are clear.  There is no pleural or pericardial effusion.  Pneumobilia is identified.  No focal liver lesion is seen.  The patient is status post cholecystectomy.  The adrenal glands, spleen and pancreas appear normal.  No renal or ureteral stones are seen. The kidneys appear normal bilaterally.  Small fat containing umbilical hernia is identified.  Uterus and adnexa are unremarkable.  The stomach, small and large bowel and appendix appear normal.  There is no lymphadenopathy or fluid.  No focal bony abnormality is identified.  IMPRESSION:  1.   Negative for urinary tract stone or hydronephrosis. 2.  Pneumobilia is presumably secondary to prior instrumentation. 3.  Status post cholecystectomy. 4.  Small fat containing umbilical hernia.   Original Report Authenticated By: Holley Dexter, M.D.     No diagnosis found.    MDM  Patient without upper abdominal pain and pneumobilia seems unrelated to today's left lower flank and back pain.  Patient does not appear to have uti and no stone seen on ct.  Awaiting wet prep and will d/c with f/u instructions. Will treat for bv.  Patient advised.   I personally performed the services described in this documentation, which was scribed in my presence. The recorded information has been reviewed and considered.   Hilario Quarry, MD 12/19/12 1047

## 2012-12-17 NOTE — ED Notes (Signed)
Pt reports being dx and treated for a uti 2 weeks ago, completed course of cipro,  She cont. "not feel well", right flank pain and nausea, no fever.

## 2012-12-17 NOTE — ED Notes (Signed)
Instructions reviewed and f/u information provided-verbalizes understanding.   

## 2012-12-17 NOTE — ED Notes (Signed)
Pt was dx w/ uti 2 weeks ago, has been on cipro (completed 1 week ago).  Now having right flank pain, decreased urination and "just don't feel good". Denies any fever, +nausea.

## 2012-12-18 LAB — URINE CULTURE: Colony Count: NO GROWTH

## 2013-03-02 ENCOUNTER — Emergency Department (HOSPITAL_COMMUNITY): Payer: Self-pay

## 2013-03-02 ENCOUNTER — Emergency Department (HOSPITAL_COMMUNITY)
Admission: EM | Admit: 2013-03-02 | Discharge: 2013-03-02 | Disposition: A | Discharge status: Home / Self Care | Payer: Self-pay | Attending: Emergency Medicine | Admitting: Emergency Medicine

## 2013-03-02 ENCOUNTER — Encounter (HOSPITAL_COMMUNITY): Payer: Self-pay

## 2013-03-02 DIAGNOSIS — R109 Unspecified abdominal pain: Secondary | ICD-10-CM

## 2013-03-02 DIAGNOSIS — Z8719 Personal history of other diseases of the digestive system: Secondary | ICD-10-CM | POA: Insufficient documentation

## 2013-03-02 DIAGNOSIS — Z8742 Personal history of other diseases of the female genital tract: Secondary | ICD-10-CM | POA: Insufficient documentation

## 2013-03-02 DIAGNOSIS — R1013 Epigastric pain: Secondary | ICD-10-CM | POA: Insufficient documentation

## 2013-03-02 DIAGNOSIS — Z79899 Other long term (current) drug therapy: Secondary | ICD-10-CM | POA: Insufficient documentation

## 2013-03-02 DIAGNOSIS — Z862 Personal history of diseases of the blood and blood-forming organs and certain disorders involving the immune mechanism: Secondary | ICD-10-CM | POA: Insufficient documentation

## 2013-03-02 HISTORY — DX: Acute pancreatitis without necrosis or infection, unspecified: K85.90

## 2013-03-02 LAB — CBC WITH DIFFERENTIAL/PLATELET
Hemoglobin: 14.2 g/dL (ref 12.0–15.0)
Lymphocytes Relative: 29 % (ref 12–46)
Lymphs Abs: 1.4 10*3/uL (ref 0.7–4.0)
MCV: 88 fL (ref 78.0–100.0)
Neutrophils Relative %: 59 % (ref 43–77)
Platelets: 244 10*3/uL (ref 150–400)
RBC: 4.59 MIL/uL (ref 3.87–5.11)
WBC: 4.6 10*3/uL (ref 4.0–10.5)

## 2013-03-02 LAB — COMPREHENSIVE METABOLIC PANEL
ALT: 20 U/L (ref 0–35)
Alkaline Phosphatase: 58 U/L (ref 39–117)
CO2: 25 mEq/L (ref 19–32)
GFR calc Af Amer: >90 mL/min (ref >90)
GFR calc non Af Amer: >90 mL/min (ref >90)
Glucose, Bld: 110 mg/dL — ABNORMAL HIGH (ref 70–99)
Potassium: 3.4 mEq/L — ABNORMAL LOW (ref 3.5–5.1)
Sodium: 135 mEq/L (ref 135–145)
Total Bilirubin: 0.3 mg/dL (ref 0.3–1.2)

## 2013-03-02 MED ORDER — GI COCKTAIL ~~LOC~~
30.0000 mL | Freq: Once | ORAL | Status: AC
  Administered 2013-03-02: 30 mL via ORAL
  Filled 2013-03-02: qty 30

## 2013-03-02 MED ORDER — TRAMADOL HCL 50 MG PO TABS: 50.0000 mg | ORAL_TABLET | Freq: Four times a day (QID) | ORAL | Status: DC | PRN

## 2013-03-02 MED ORDER — RANITIDINE HCL 150 MG PO CAPS: 150.0000 mg | ORAL_CAPSULE | Freq: Two times a day (BID) | ORAL | Status: DC

## 2013-03-02 NOTE — ED Provider Notes (Signed)
CSN: [id number]     Arrival date & time 03/02/13  0419 History     First MD Initiated Contact with Patient 03/02/13 0444     Chief Complaint  Patient presents with  . Abdominal Pain   (Consider location/radiation/quality/duration/timing/severity/associated sxs/prior Treatment) Patient is a 47 y.o. female presenting with abdominal pain. The history is provided by the patient (the pt complains of epigastric abd pain).  Abdominal Pain Pain location:  Epigastric Pain quality: aching   Pain radiates to:  Epigastric region Pain severity:  Moderate Onset quality:  Gradual Timing:  Intermittent Progression:  Waxing and waning Chronicity:  New Associated symptoms: no chest pain, no cough, no diarrhea, no fatigue and no hematuria     Past Medical History  Diagnosis Date  . Anemia   . Fibroids   . Endometriosis   . Pancreatitis 09/2010   Past Surgical History  Procedure Laterality Date  . Tonsillectomy      child  . Laparoscopic tubal ligation  1995  . Tubal ligation    . Tonsillectomy    . Laparoscopic cholecystectomy  09/2010    [facility]  . Supracervical abdominal hysterectomy  02/15/2012    Procedure: HYSTERECTOMY SUPRACERVICAL ABDOMINAL;  Surgeon: Lazaro Arms, MD;  Location: [facility];  Service: Gynecology;  Laterality: N/A;  . Abdominal hysterectomy     Family History  Problem Relation Age of Onset  . Heart attack Father    History  Substance Use Topics  . Smoking status: Never Smoker   . Smokeless tobacco: Not on file  . Alcohol Use: No   OB History   Grav Para Term Preterm Abortions TAB SAB Ect Mult Living   6 6 6  0 0 0 0 0 0 6     Review of Systems  Constitutional: Negative for appetite change and fatigue.  HENT: Negative for congestion, sinus pressure and ear discharge.   Eyes: Negative for discharge.  Respiratory: Negative for cough.   Cardiovascular: Negative for chest pain.  Gastrointestinal: Positive for abdominal pain. Negative for diarrhea.   Genitourinary: Negative for frequency and hematuria.  Musculoskeletal: Negative for back pain.  Skin: Negative for rash.  Neurological: Negative for seizures and headaches.  Psychiatric/Behavioral: Negative for hallucinations.    Allergies  Hydrocodone  Home Medications   Current Outpatient Rx  Name  Route  Sig  Dispense  Refill  . ibuprofen (ADVIL,MOTRIN) 200 MG tablet   Oral   Take 200 mg by mouth every 6 (six) hours as needed for pain.         . Multiple Vitamin (MULTIVITAMIN) capsule   Oral   Take 1 capsule by mouth daily.         . Phenazopyridine HCl (AZO TABS PO)   Oral   Take 1 tablet by mouth 2 (two) times daily.         . ranitidine (ZANTAC) 150 MG capsule   Oral   Take 1 capsule (150 mg total) by mouth 2 (two) times daily.   60 capsule   0   . traMADol (ULTRAM) 50 MG tablet   Oral   Take 1 tablet (50 mg total) by mouth every 6 (six) hours as needed for pain.   40 tablet   0    BP 151/82  Pulse 72  Temp(Src) 98.5 F (36.9 C) (Oral)  Resp 18  Ht 5\' 1"  (1.549 m)  Wt 178 lb (80.74 kg)  BMI 33.65 kg/m2  SpO2 99%  LMP 01/25/2012 Physical Exam  Constitutional: She is oriented to person, place, and time. She appears well-developed.  HENT:  Head: Normocephalic.  Eyes: Conjunctivae and EOM are normal. No scleral icterus.  Neck: Neck supple. No thyromegaly present.  Cardiovascular: Normal rate and regular rhythm.  Exam reveals no gallop and no friction rub.   No murmur heard. Pulmonary/Chest: No stridor. She has no wheezes. She has no rales. She exhibits no tenderness.  Abdominal: She exhibits no distension. There is tenderness. There is no rebound.  Tender epigastric  Musculoskeletal: Normal range of motion. She exhibits no edema.  Lymphadenopathy:    She has no cervical adenopathy.  Neurological: She is oriented to person, place, and time. Coordination normal.  Skin: No rash noted. No erythema.  Psychiatric: She has a normal mood and affect.  Her behavior is normal.    ED Course   Procedures (including critical care time)  Labs Reviewed  COMPREHENSIVE METABOLIC PANEL - Abnormal; Notable for the following:    Potassium 3.4 (*)    Glucose, Bld 110 (*)    All other components within normal limits  CBC WITH DIFFERENTIAL  LIPASE, BLOOD  URINALYSIS, ROUTINE W REFLEX MICROSCOPIC   Dg Abd Acute W/chest  03/02/2013   *RADIOLOGY REPORT*  Clinical Data: Pain  ACUTE ABDOMEN SERIES (ABDOMEN 2 VIEW & CHEST 1 VIEW)  Comparison: Prior CT from 12/17/2012  Findings: Cardiac and mediastinal silhouettes within normal limits.  The lungs are normally inflated.  No airspace consolidation, pleural effusion, or pulmonary edema is identified.  Visualized bowel gas pattern is nonobstructive.  No dilated loops of bowel to suggest obstruction or ileus are identified. No free intraperitoneal air.  Moderate amount retained stool is present within the right colon.  No soft tissue masses or abnormal calcifications.  Cholecystectomy clips are seen within the right upper quadrant.  Several clips overlie the right hemi pelvis.  No acute osseous abnormality identified.  IMPRESSION: 1.  Nonobstructive bowel gas pattern. 2.  No acute cardiopulmonary process.   Original Report Authenticated By: Rise Mu, M.D.   1. Abdominal pain     MDM    Benny Lennert, MD 03/02/13 [phone]

## 2013-03-02 NOTE — ED Notes (Signed)
Epigastric pain and left lower stinging pain for over a week. Pt denies n/v/d

## 2013-03-02 NOTE — ED Notes (Signed)
Informed patient that a urine specimen was needed. Patient unable to void at this time , states that she went before she got here. And not able to have water due to having GI cocktail.

## 2014-05-19 ENCOUNTER — Encounter (HOSPITAL_COMMUNITY): Payer: Self-pay

## 2014-10-03 ENCOUNTER — Emergency Department (HOSPITAL_COMMUNITY): Payer: 59

## 2014-10-03 ENCOUNTER — Encounter (HOSPITAL_COMMUNITY): Payer: Self-pay | Admitting: *Deleted

## 2014-10-03 ENCOUNTER — Emergency Department (HOSPITAL_COMMUNITY)
Admission: EM | Admit: 2014-10-03 | Discharge: 2014-10-03 | Disposition: A | Discharge status: Home / Self Care | Payer: 59 | Attending: Emergency Medicine | Admitting: Emergency Medicine

## 2014-10-03 DIAGNOSIS — J40 Bronchitis, not specified as acute or chronic: Secondary | ICD-10-CM | POA: Diagnosis not present

## 2014-10-03 DIAGNOSIS — Z79899 Other long term (current) drug therapy: Secondary | ICD-10-CM | POA: Insufficient documentation

## 2014-10-03 DIAGNOSIS — B349 Viral infection, unspecified: Secondary | ICD-10-CM | POA: Diagnosis not present

## 2014-10-03 DIAGNOSIS — E663 Overweight: Secondary | ICD-10-CM | POA: Diagnosis not present

## 2014-10-03 DIAGNOSIS — Z8719 Personal history of other diseases of the digestive system: Secondary | ICD-10-CM | POA: Diagnosis not present

## 2014-10-03 DIAGNOSIS — R52 Pain, unspecified: Secondary | ICD-10-CM | POA: Diagnosis present

## 2014-10-03 DIAGNOSIS — Z8742 Personal history of other diseases of the female genital tract: Secondary | ICD-10-CM | POA: Insufficient documentation

## 2014-10-03 DIAGNOSIS — Z862 Personal history of diseases of the blood and blood-forming organs and certain disorders involving the immune mechanism: Secondary | ICD-10-CM | POA: Diagnosis not present

## 2014-10-03 LAB — INFLUENZA PANEL BY PCR (TYPE A & B)
H1N1 flu by pcr: DETECTED — AB
INFLBPCR: NEGATIVE
Influenza A By PCR: POSITIVE — AB

## 2014-10-03 MED ORDER — ALBUTEROL SULFATE HFA 108 (90 BASE) MCG/ACT IN AERS
2.0000 | INHALATION_SPRAY | RESPIRATORY_TRACT | Status: DC | PRN
Start: 2014-10-03 — End: 2016-05-05

## 2014-10-03 MED ORDER — IBUPROFEN 200 MG PO TABS: 400.0000 mg | ORAL_TABLET | Freq: Four times a day (QID) | ORAL | Status: DC | PRN

## 2014-10-03 MED ORDER — ALBUTEROL SULFATE HFA 108 (90 BASE) MCG/ACT IN AERS
2.0000 | INHALATION_SPRAY | RESPIRATORY_TRACT | Status: DC | PRN
  Administered 2014-10-03: 2 via RESPIRATORY_TRACT
  Filled 2014-10-03: qty 6.7

## 2014-10-03 NOTE — ED Notes (Signed)
Airborne precautions ordered by D. Horton, I informed charge nurse Bethena Roys to the precautions and to the need of moving patient to a negative air pressure room. N95 placed on patient for transport to radiology, called and alert radiology staff about Airborne precautions.

## 2014-10-03 NOTE — ED Provider Notes (Addendum)
CSN: [id number]     Arrival date & time 10/03/14  1121 History  This chart was scribed for No att. providers found by Edison Simon, ED Scribe. This patient was seen in room APA07/APA07 and the patient's care was started at 12:06 PM.    Chief Complaint  Patient presents with  . Generalized Body Aches  . Emesis   The history is provided by the patient. No language interpreter was used.    HPI Comments: Elaine [name] is a 49 y.o. female who presents to the Emergency Department complaining of cough with onset 2 weeks, with subsequent onset of other symptoms. She states cough improved, but 4 days ago began to have body aches, fever measured at 101 which has resolved, night sweats, chills, and coughing. She states cough is productive. She states she began having nausea last night, then began having diarrhea and vomiting this morning. She states she has tried Tylenol, Dayquil, Nyquil, and Ibuprofen. She works as a Web designer. She denies medical problems. She denies smoking. She denies abdominal pain. She denies blood in stool or emesis.  Past Medical History  Diagnosis Date  . Anemia   . Fibroids   . Endometriosis   . Pancreatitis 09/2010   Past Surgical History  Procedure Laterality Date  . Tonsillectomy      child  . Laparoscopic tubal ligation  1995  . Tubal ligation    . Tonsillectomy    . Laparoscopic cholecystectomy  09/2010    [facility]  . Supracervical abdominal hysterectomy  02/15/2012    Procedure: HYSTERECTOMY SUPRACERVICAL ABDOMINAL;  Surgeon: Florian Buff, MD;  Location: [facility];  Service: Gynecology;  Laterality: N/A;  . Abdominal hysterectomy     Family History  Problem Relation Age of Onset  . Heart attack Father    History  Substance Use Topics  . Smoking status: Never Smoker   . Smokeless tobacco: Not on file  . Alcohol Use: No   OB History    Gravida Para Term Preterm AB TAB SAB Ectopic Multiple Living   6 6 6  0 0 0 0 0 0 6     Review of Systems   Constitutional: Positive for fever.  Respiratory: Positive for cough. Negative for chest tightness and shortness of breath.   Cardiovascular: Negative for chest pain.  Gastrointestinal: Positive for nausea and vomiting. Negative for abdominal pain.  Genitourinary: Negative for dysuria.  Musculoskeletal: Negative for back pain.  Skin: Negative for rash.  Neurological: Negative for headaches.  Psychiatric/Behavioral: Negative for confusion.  All other systems reviewed and are negative.     Allergies  Hydrocodone  Home Medications   Prior to Admission medications   Medication Sig Start Date End Date Taking? Authorizing Provider  Multiple Vitamin (MULTIVITAMIN) capsule Take 1 capsule by mouth daily.   Yes Historical Provider, MD  Pseudoeph-Doxylamine-DM-APAP (NYQUIL PO) Take 2 capsules by mouth at bedtime as needed (cold symptoms).   Yes Historical Provider, MD  Pseudoephedrine-APAP-DM (DAYQUIL PO) Take 2 capsules by mouth daily as needed (cold symptoms).   Yes Historical Provider, MD  albuterol (PROVENTIL HFA;VENTOLIN HFA) 108 (90 BASE) MCG/ACT inhaler Inhale 2 puffs into the lungs every 4 (four) hours as needed for wheezing or shortness of breath. 10/03/14   Merryl Hacker, MD  ibuprofen (ADVIL,MOTRIN) 200 MG tablet Take 2 tablets (400 mg total) by mouth every 6 (six) hours as needed. 10/03/14   Merryl Hacker, MD  ranitidine (ZANTAC) 150 MG capsule Take 1 capsule (150 mg  total) by mouth 2 (two) times daily. Patient not taking: Reported on 10/03/2014 03/02/13   Milton Ferguson, MD  traMADol (ULTRAM) 50 MG tablet Take 1 tablet (50 mg total) by mouth every 6 (six) hours as needed for pain. Patient not taking: Reported on 10/03/2014 03/02/13   Milton Ferguson, MD   BP 149/97 mmHg  Pulse 79  Temp(Src) 99.9 F (37.7 C) (Oral)  Resp 18  Ht 5\' 1"  (1.549 m)  Wt 198 lb (89.812 kg)  BMI 37.43 kg/m2  SpO2 98%  LMP 01/25/2012 Physical Exam  Constitutional: She is oriented to person, place,  and time. She appears well-developed and well-nourished. No distress.  overweight  HENT:  Head: Normocephalic and atraumatic.  Mouth/Throat: Oropharynx is clear and moist.  Neck: Neck supple.  Cardiovascular: Normal rate, regular rhythm and normal heart sounds.   No murmur heard. Pulmonary/Chest: Effort normal. No respiratory distress. She has wheezes.  Abdominal: Soft. Bowel sounds are normal. There is no tenderness. There is no rebound.  Musculoskeletal: She exhibits no edema.  Neurological: She is alert and oriented to person, place, and time.  Skin: Skin is warm and dry.  Psychiatric: She has a normal mood and affect.  Nursing note and vitals reviewed.   ED Course  Procedures (including critical care time)  DIAGNOSTIC STUDIES: Oxygen Saturation is 100% on room air, normal by my interpretation.    COORDINATION OF CARE: 12:10 PM Discussed treatment plan with patient at beside, the patient agrees with the plan and has no further questions at this time.   Labs Review Labs Reviewed  INFLUENZA PANEL BY PCR (TYPE A & B, H1N1) - Abnormal; Notable for the following:    Influenza A By PCR POSITIVE (*)    H1N1 flu by pcr DETECTED (*)    All other components within normal limits    Imaging Review Dg Chest 2 View  10/03/2014   CLINICAL DATA:  Body aches. Productive cough. Chest congestion. Fever.  EXAM: CHEST - 2 VIEW  COMPARISON:  Acute abdominal series 03/02/2013.  FINDINGS: Low lung volumes exaggerate the heart size. Mild parahilar bronchitic changes are evident bilaterally. No focal airspace consolidation is evident. There are no effusions. The visualized soft tissues and bony thorax are unremarkable.  IMPRESSION: 1. Mild parahilar bronchitic changes bilaterally may be related to an acute viral process. 2. No focal airspace consolidation.   Electronically Signed   By: San Morelle M.D.   On: 10/03/2014 13:21     EKG Interpretation None      MDM   Final diagnoses:   Bronchitis  Viral syndrome   Patient presents with two-week history of cough and 2 day history of fever, nausea, vomiting, diarrhea, and bodyaches. Onset of fever approximate 48 hours ago. Patient is nontoxic-appearing and afebrile. She appears well-hydrated. Discussed with patient that this could be the flu but she is out of the window for Tamiflu. Given persistent cough, will obtain chest x-ray to evaluate for pneumonia. 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 Influenza swab sent. Chest x-ray without evidence of pneumonia. Discuss with patient supportive care at home. I gave the patient the option of waiting for her influenza results versus giving her a call when they return. Patient opted to be discharged. Will follow-up with influenza results and call patient.  After history, exam, and medical workup I feel the patient has been appropriately medically screened and is safe for discharge home. Pertinent diagnoses were discussed with the patient.  Patient was given return precautions.    After history, exam, and medical workup I feel the patient has been appropriately medically screened and is safe for discharge home. Pertinent diagnoses were discussed with the patient. Patient was given return precautions.   Merryl Hacker, MD 10/04/14 [phone]   Patient called and voicemail left regarding +FLu and H1N1.  Merryl Hacker, MD 10/04/14 2027

## 2014-10-03 NOTE — ED Notes (Signed)
Cough x 2 weeks.  Began having fever 101 orally Wednesday w/n/v/d and body aches.  Has taken Dayquil, Nyquil and Ibuprofen.  Has been around URI infections at work.

## 2014-10-03 NOTE — ED Notes (Signed)
Lab called positive result of flu a and h1n1 . edp aware and result called to pt

## 2014-10-03 NOTE — Discharge Instructions (Signed)
You were evaluated today for cough, fevers, viral symptoms. This may be the flu. Your outside the window for Tamiflu. You will be called with your flu results. You should use supportive care at home and he will be given an albuterol inhaler.  Cough, Adult  A cough is a reflex that helps clear your throat and airways. It can help heal the body or may be a reaction to an irritated airway. A cough may only last 2 or 3 weeks (acute) or may last more than 8 weeks (chronic).  CAUSES Acute cough:  Viral or bacterial infections. Chronic cough:  Infections.  Allergies.  Asthma.  Post-nasal drip.  Smoking.  Heartburn or acid reflux.  Some medicines.  Chronic lung problems (COPD).  Cancer. SYMPTOMS   Cough.  Fever.  Chest pain.  Increased breathing rate.  High-pitched whistling sound when breathing (wheezing).  Colored mucus that you cough up (sputum). TREATMENT   A bacterial cough may be treated with antibiotic medicine.  A viral cough must run its course and will not respond to antibiotics.  Your caregiver may recommend other treatments if you have a chronic cough. HOME CARE INSTRUCTIONS   Only take over-the-counter or prescription medicines for pain, discomfort, or fever as directed by your caregiver. Use cough suppressants only as directed by your caregiver.  Use a cold steam vaporizer or humidifier in your bedroom or home to help loosen secretions.  Sleep in a semi-upright position if your cough is worse at night.  Rest as needed.  Stop smoking if you smoke. SEEK IMMEDIATE MEDICAL CARE IF:   You have pus in your sputum.  Your cough starts to worsen.  You cannot control your cough with suppressants and are losing sleep.  You begin coughing up blood.  You have difficulty breathing.  You develop pain which is getting worse or is uncontrolled with medicine.  You have a fever. MAKE SURE YOU:   Understand these instructions.  Will watch your  condition.  Will get help right away if you are not doing well or get worse. Document Released: 12/31/2010 Document Revised: 09/26/2011 Document Reviewed: 12/31/2010 [facility] Patient Information 2015 [place], [place]. This information is not intended to replace advice given to you by your health care provider. Make sure you discuss any questions you have with your health care provider. Viral Infections A virus is a type of germ. Viruses can cause:  Minor sore throats.  Aches and pains.  Headaches.  Runny nose.  Rashes.  Watery eyes.  Tiredness.  Coughs.  Loss of appetite.  Feeling sick to your stomach (nausea).  Throwing up (vomiting).  Watery poop (diarrhea). HOME CARE   Only take medicines as told by your doctor.  Drink enough water and fluids to keep your pee (urine) clear or pale yellow. Sports drinks are a good choice.  Get plenty of rest and eat healthy. Soups and broths with crackers or rice are fine. GET HELP RIGHT AWAY IF:   You have a very bad headache.  You have shortness of breath.  You have chest pain or neck pain.  You have an unusual rash.  You cannot stop throwing up.  You have watery poop that does not stop.  You cannot keep fluids down.  You or your child has a temperature by mouth above 102 F (38.9 C), not controlled by medicine.  Your baby is older than 3 months with a rectal temperature of 102 F (38.9 C) or higher.  Your baby is 3 months  old or younger with a rectal temperature of 100.4 F (38 C) or higher. MAKE SURE YOU:   Understand these instructions.  Will watch this condition.  Will get help right away if you are not doing well or get worse. Document Released: 06/16/2008 Document Revised: 09/26/2011 Document Reviewed: 11/09/2010 [facility] Patient Information 2015 [place], [place]. This information is not intended to replace advice given to you by your health care provider. Make sure you discuss any questions you have with  your health care provider.

## 2015-03-19 ENCOUNTER — Encounter (HOSPITAL_COMMUNITY): Payer: Self-pay | Admitting: Emergency Medicine

## 2015-03-19 ENCOUNTER — Emergency Department (HOSPITAL_COMMUNITY)
Admission: EM | Admit: 2015-03-19 | Discharge: 2015-03-19 | Disposition: A | Discharge status: Home / Self Care | Payer: 59 | Attending: Emergency Medicine | Admitting: Emergency Medicine

## 2015-03-19 DIAGNOSIS — N952 Postmenopausal atrophic vaginitis: Secondary | ICD-10-CM | POA: Insufficient documentation

## 2015-03-19 DIAGNOSIS — Z862 Personal history of diseases of the blood and blood-forming organs and certain disorders involving the immune mechanism: Secondary | ICD-10-CM | POA: Diagnosis not present

## 2015-03-19 DIAGNOSIS — Z8719 Personal history of other diseases of the digestive system: Secondary | ICD-10-CM | POA: Diagnosis not present

## 2015-03-19 DIAGNOSIS — Z79899 Other long term (current) drug therapy: Secondary | ICD-10-CM | POA: Diagnosis not present

## 2015-03-19 DIAGNOSIS — Z86010 Personal history of colonic polyps: Secondary | ICD-10-CM | POA: Diagnosis not present

## 2015-03-19 DIAGNOSIS — R300 Dysuria: Secondary | ICD-10-CM | POA: Diagnosis present

## 2015-03-19 LAB — WET PREP, GENITAL
Clue Cells Wet Prep HPF POC: NONE SEEN
TRICH WET PREP: NONE SEEN
WBC WET PREP: NONE SEEN
YEAST WET PREP: NONE SEEN

## 2015-03-19 LAB — URINALYSIS, ROUTINE W REFLEX MICROSCOPIC
Bilirubin Urine: NEGATIVE
Glucose, UA: NEGATIVE mg/dL
Hgb urine dipstick: NEGATIVE
Ketones, ur: NEGATIVE mg/dL
LEUKOCYTES UA: NEGATIVE
NITRITE: NEGATIVE
PH: 6 (ref 5.0–8.0)
Protein, ur: NEGATIVE mg/dL
UROBILINOGEN UA: 0.2 mg/dL (ref 0.0–1.0)

## 2015-03-19 MED ORDER — ESTROGENS CONJUGATED 0.3 MG PO TABS: 0.3000 mg | ORAL_TABLET | Freq: Every day | ORAL | Status: DC

## 2015-03-19 NOTE — ED Notes (Signed)
Denies vag d/c or odor.

## 2015-03-19 NOTE — ED Notes (Signed)
Pt c/o suprapubic pain intermittent with painful urination x 2 weeks. Nausea x 1 week.  Nad.

## 2015-03-19 NOTE — Discharge Instructions (Signed)

## 2015-03-21 LAB — RPR: RPR Ser Ql: NONREACTIVE

## 2015-03-21 LAB — HIV ANTIBODY (ROUTINE TESTING W REFLEX): HIV Screen 4th Generation wRfx: NONREACTIVE

## 2015-03-21 NOTE — ED Provider Notes (Signed)
CSN: [id number]     Arrival date & time 03/19/15  1820 History   First MD Initiated Contact with Patient 03/19/15 1832     Chief Complaint  Patient presents with  . Dysuria     (Consider location/radiation/quality/duration/timing/severity/associated sxs/prior Treatment) The history is provided by the patient.   Elaine [name] is a 49 y.o. female presenting with painful urination with hesitancy starting approximately 2 weeks ago.  Additionally she reports mild nausea, but denies fevers or chills, vomiting, abdominal or flank pain.  She has had no hematuria, but does report mild clear vaginal discharge.  She is not currently sexually active.  She reports being perimenopausal and had similar symptoms earlier this spring which resulted in difficulty urinating at all and was improved with a short course of po estrogen.  She is not currently on hormone replacement, but is scheduled to see her gynecologist (in [facility]) next week.     Past Medical History  Diagnosis Date  . Anemia   . Fibroids   . Endometriosis   . Pancreatitis 09/2010   Past Surgical History  Procedure Laterality Date  . Tonsillectomy      child  . Laparoscopic tubal ligation  1995  . Tubal ligation    . Tonsillectomy    . Laparoscopic cholecystectomy  09/2010    [facility]  . Supracervical abdominal hysterectomy  02/15/2012    Procedure: HYSTERECTOMY SUPRACERVICAL ABDOMINAL;  Surgeon: Florian Buff, MD;  Location: [facility];  Service: Gynecology;  Laterality: N/A;  . Abdominal hysterectomy     Family History  Problem Relation Age of Onset  . Heart attack Father    Social History  Substance Use Topics  . Smoking status: Never Smoker   . Smokeless tobacco: None  . Alcohol Use: No   OB History    Gravida Para Term Preterm AB TAB SAB Ectopic Multiple Living   6 6 6  0 0 0 0 0 0 6     Review of Systems  Constitutional: Negative for fever.  HENT: Negative for congestion and sore throat.   Eyes: Negative.   Respiratory:  Negative for chest tightness and shortness of breath.   Cardiovascular: Negative for chest pain.  Gastrointestinal: Negative for nausea and abdominal pain.  Genitourinary: Positive for dysuria and vaginal discharge. Negative for urgency, frequency, hematuria and genital sores.  Musculoskeletal: Negative for joint swelling, arthralgias and neck pain.  Skin: Negative.  Negative for rash and wound.  Neurological: Negative for dizziness, weakness, light-headedness, numbness and headaches.  Psychiatric/Behavioral: Negative.       Allergies  Hydrocodone  Home Medications   Prior to Admission medications   Medication Sig Start Date End Date Taking? Authorizing Provider  Biotin 5 MG TABS Take 5 mg by mouth daily.   Yes Historical Provider, MD  metoprolol tartrate (LOPRESSOR) 25 MG tablet Take 12.5 mg by mouth daily.   Yes Historical Provider, MD  Multiple Vitamin (MULTIVITAMIN) capsule Take 1 capsule by mouth daily.   Yes Historical Provider, MD  pyridOXINE (VITAMIN B-6) 100 MG tablet Take 100 mg by mouth daily.   Yes Historical Provider, MD  ranitidine (ZANTAC) 150 MG capsule Take 1 capsule (150 mg total) by mouth 2 (two) times daily. Patient taking differently: Take 150 mg by mouth daily as needed for heartburn.  03/02/13  Yes Milton Ferguson, MD  albuterol (PROVENTIL HFA;VENTOLIN HFA) 108 (90 BASE) MCG/ACT inhaler Inhale 2 puffs into the lungs every 4 (four) hours as needed for wheezing or shortness of  breath. 10/03/14   Merryl Hacker, MD  estrogens, conjugated, (PREMARIN) 0.3 MG tablet Take 1 tablet (0.3 mg total) by mouth daily. Take daily for 21 days then do not take for 7 days. 03/19/15   Evalee Jefferson, PA-C  ibuprofen (ADVIL,MOTRIN) 200 MG tablet Take 2 tablets (400 mg total) by mouth every 6 (six) hours as needed. Patient taking differently: Take 400 mg by mouth every 6 (six) hours as needed for mild pain.  10/03/14   Merryl Hacker, MD  traMADol (ULTRAM) 50 MG tablet Take 1 tablet (50 mg  total) by mouth every 6 (six) hours as needed for pain. Patient not taking: Reported on 10/03/2014 03/02/13   Milton Ferguson, MD   BP 158/78 mmHg  Pulse 70  Temp(Src) 98 F (36.7 C) (Oral)  Resp 16  Ht 5\' 1"  (1.549 m)  Wt 198 lb (89.812 kg)  BMI 37.43 kg/m2  SpO2 100%  LMP 01/25/2012 Physical Exam  Constitutional: She appears well-developed and well-nourished.  HENT:  Head: Normocephalic and atraumatic.  Eyes: Conjunctivae are normal.  Cardiovascular: Normal rate and regular rhythm.   Pulmonary/Chest: Effort normal and breath sounds normal. She has no wheezes.  Abdominal: Soft. Bowel sounds are normal. There is no tenderness.  Genitourinary: There is no rash, tenderness or lesion on the right labia. There is no rash, tenderness or lesion on the left labia. No erythema or tenderness in the vagina. No vaginal discharge found.  Dry vaginal mucosa.  Musculoskeletal: Normal range of motion.  Neurological: She is alert.  Skin: Skin is warm and dry.  Psychiatric: She has a normal mood and affect.  Nursing note and vitals reviewed.   ED Course  Procedures (including critical care time) Labs Review Labs Reviewed  URINALYSIS, ROUTINE W REFLEX MICROSCOPIC (NOT AT [facility]) - Abnormal; Notable for the following:    Specific Gravity, Urine >1.030 (*)    All other components within normal limits  WET PREP, GENITAL  RPR  HIV ANTIBODY (ROUTINE TESTING)  GC/CHLAMYDIA PROBE AMP ([place]) NOT AT [facility]    Imaging Review No results found. I have personally reviewed and evaluated these images and lab results as part of my medical decision-making.   EKG Interpretation None      MDM   Final diagnoses:  Vaginal atrophy    Patients labs reviewed.  Radiological studies were viewed, interpreted and considered during the medical decision making and disposition process. I agree with radiologists reading.  Results were also discussed with patient.  Discussed with pt that cultures are pending.   Wet prep negative.  Pt is perimenopausal in setting of partial hysterectomy, suspect vaginal mucosal changes c/w decreased estrogen causing sx.  Sx have responded in the past to estrogen replacement. Short course given, pt to f/u with gyn next week as planned.   Evalee Jefferson, PA-C 03/21/15 2129  Fredia Sorrow, MD 03/25/15 [phone]

## 2015-03-24 LAB — GC/CHLAMYDIA PROBE AMP (~~LOC~~) NOT AT ARMC
Chlamydia: NEGATIVE
Neisseria Gonorrhea: NEGATIVE

## 2016-04-27 IMAGING — DX DG CHEST 2V
2 series · 2 of 2 positions shown · non-contrast
Comparison: Acute abdominal series 03/02/2013.

CLINICAL DATA: Body aches. Productive cough. Chest congestion.
Fever.

EXAM:
CHEST - 2 VIEW

[chest pa]
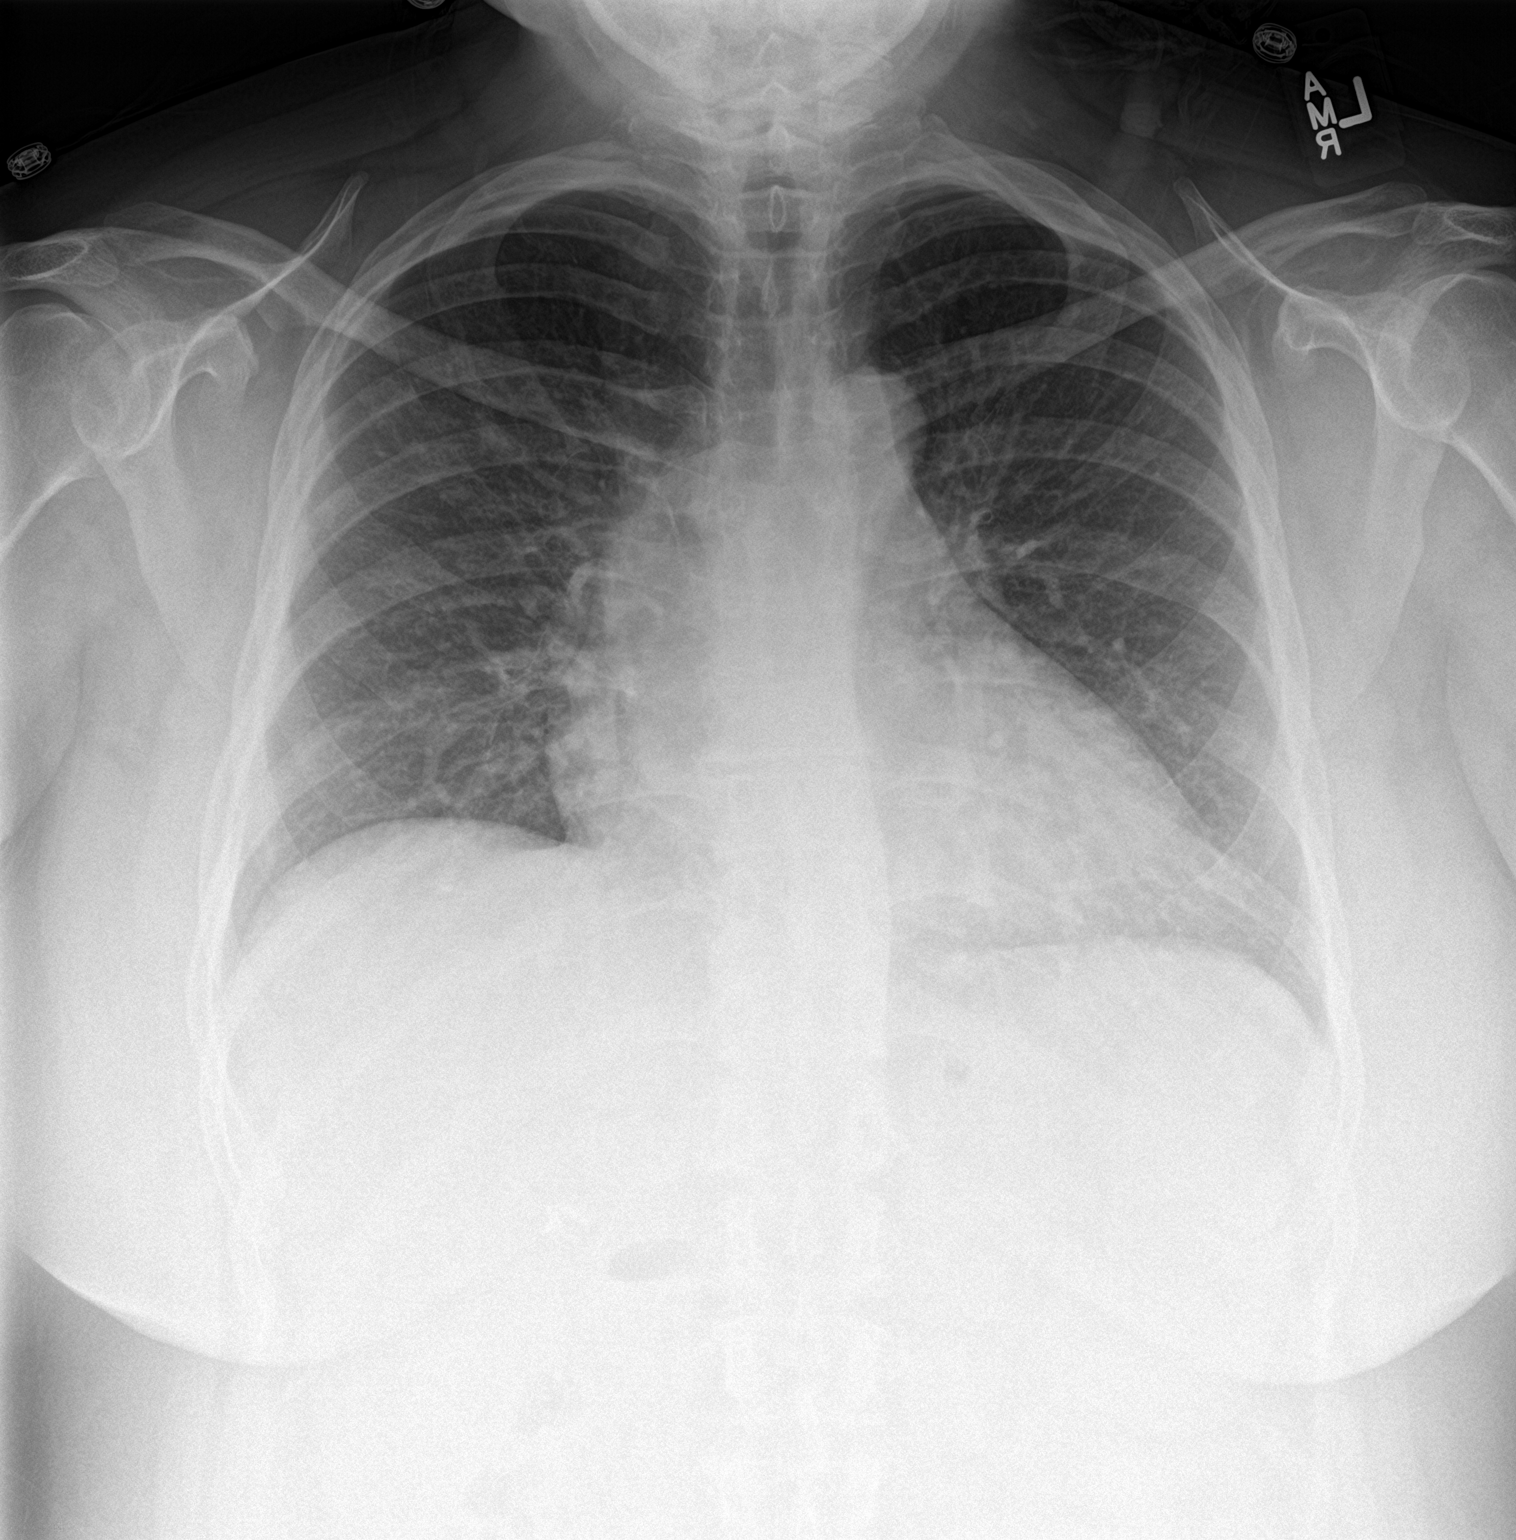

[chest lat]
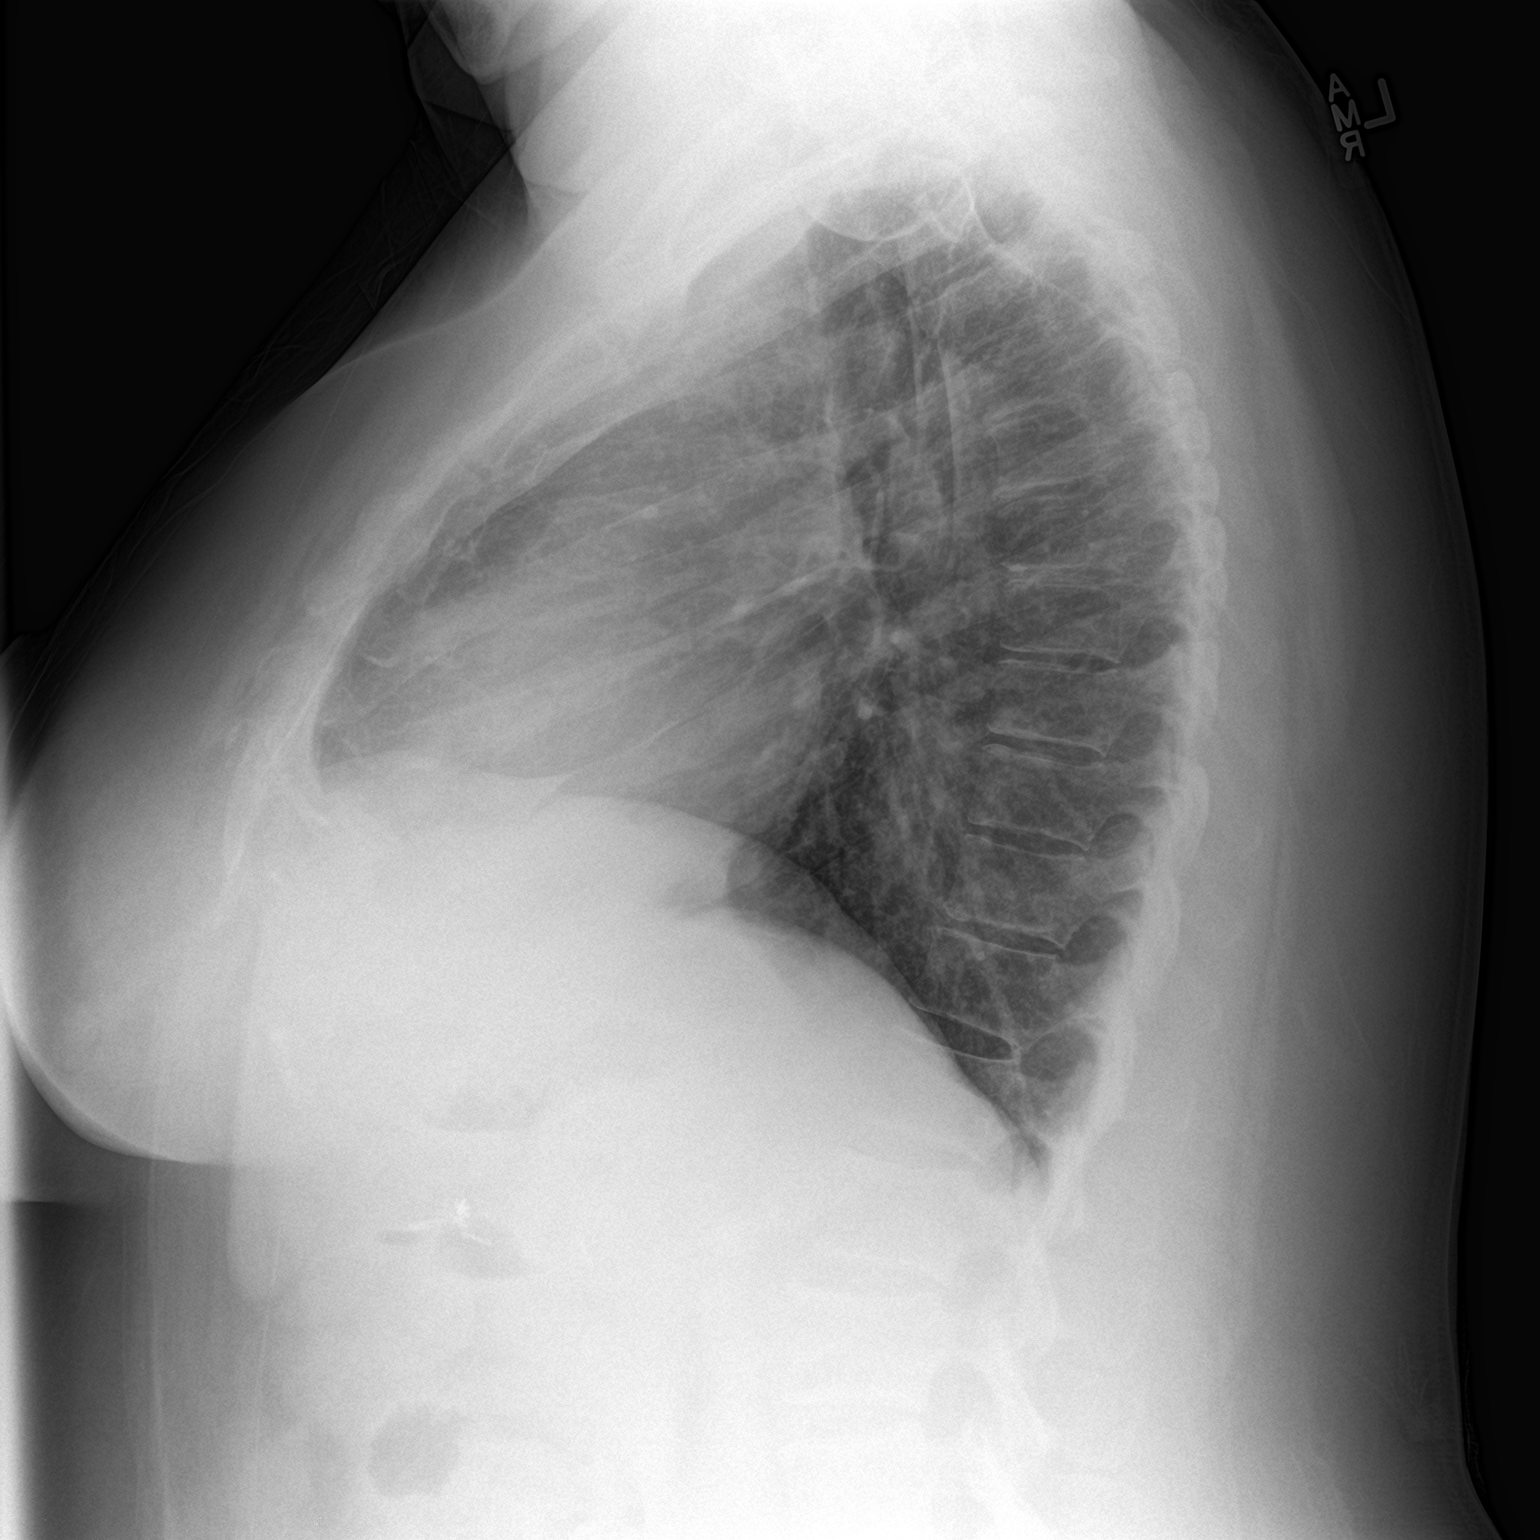

[2 of 2 positions shown; findings below may reference images not displayed]

FINDINGS: Low lung volumes exaggerate the heart size. Mild parahilar
bronchitic changes are evident bilaterally. No focal airspace
consolidation is evident. There are no effusions. The visualized
soft tissues and bony thorax are unremarkable.
IMPRESSION: 1. Mild parahilar bronchitic changes bilaterally may be related to
an acute viral process.
2. No focal airspace consolidation.

## 2016-05-05 ENCOUNTER — Emergency Department (HOSPITAL_COMMUNITY): Payer: Self-pay

## 2016-05-05 ENCOUNTER — Inpatient Hospital Stay (HOSPITAL_COMMUNITY)
Admission: EM | Admit: 2016-05-05 | Discharge: 2016-05-06 | DRG: 309 | Disposition: A | Discharge status: Home / Self Care | Payer: 59 | Attending: Internal Medicine | Admitting: Internal Medicine

## 2016-05-05 ENCOUNTER — Encounter (HOSPITAL_COMMUNITY): Payer: Self-pay

## 2016-05-05 ENCOUNTER — Observation Stay (HOSPITAL_COMMUNITY): Payer: Self-pay

## 2016-05-05 DIAGNOSIS — I480 Paroxysmal atrial fibrillation: Principal | ICD-10-CM | POA: Diagnosis present

## 2016-05-05 DIAGNOSIS — R079 Chest pain, unspecified: Secondary | ICD-10-CM | POA: Diagnosis not present

## 2016-05-05 DIAGNOSIS — I4891 Unspecified atrial fibrillation: Secondary | ICD-10-CM | POA: Diagnosis not present

## 2016-05-05 DIAGNOSIS — I248 Other forms of acute ischemic heart disease: Secondary | ICD-10-CM | POA: Diagnosis present

## 2016-05-05 DIAGNOSIS — Z8249 Family history of ischemic heart disease and other diseases of the circulatory system: Secondary | ICD-10-CM

## 2016-05-05 DIAGNOSIS — R140 Abdominal distension (gaseous): Secondary | ICD-10-CM | POA: Diagnosis not present

## 2016-05-05 DIAGNOSIS — I10 Essential (primary) hypertension: Secondary | ICD-10-CM | POA: Diagnosis not present

## 2016-05-05 DIAGNOSIS — J811 Chronic pulmonary edema: Secondary | ICD-10-CM | POA: Diagnosis present

## 2016-05-05 DIAGNOSIS — Z7901 Long term (current) use of anticoagulants: Secondary | ICD-10-CM

## 2016-05-05 DIAGNOSIS — J810 Acute pulmonary edema: Secondary | ICD-10-CM

## 2016-05-05 DIAGNOSIS — R0600 Dyspnea, unspecified: Secondary | ICD-10-CM

## 2016-05-05 DIAGNOSIS — E0781 Sick-euthyroid syndrome: Secondary | ICD-10-CM | POA: Diagnosis present

## 2016-05-05 DIAGNOSIS — Z79890 Hormone replacement therapy (postmenopausal): Secondary | ICD-10-CM

## 2016-05-05 DIAGNOSIS — Z6841 Body Mass Index (BMI) 40.0 and over, adult: Secondary | ICD-10-CM

## 2016-05-05 DIAGNOSIS — Z79899 Other long term (current) drug therapy: Secondary | ICD-10-CM

## 2016-05-05 DIAGNOSIS — Z5181 Encounter for therapeutic drug level monitoring: Secondary | ICD-10-CM

## 2016-05-05 DIAGNOSIS — K219 Gastro-esophageal reflux disease without esophagitis: Secondary | ICD-10-CM | POA: Diagnosis present

## 2016-05-05 DIAGNOSIS — Z885 Allergy status to narcotic agent status: Secondary | ICD-10-CM

## 2016-05-05 HISTORY — DX: Gastro-esophageal reflux disease without esophagitis: K21.9

## 2016-05-05 HISTORY — DX: Essential (primary) hypertension: I10

## 2016-05-05 LAB — COMPREHENSIVE METABOLIC PANEL
ALT: 29 U/L (ref 14–54)
AST: 19 U/L (ref 15–41)
Albumin: 4.2 g/dL (ref 3.5–5.0)
Alkaline Phosphatase: 62 U/L (ref 38–126)
Anion gap: 8 (ref 5–15)
BUN: 19 mg/dL (ref 6–20)
CO2: 22 mmol/L (ref 22–32)
CREATININE: 0.81 mg/dL (ref 0.44–1.00)
Calcium: 8.8 mg/dL — ABNORMAL LOW (ref 8.9–10.3)
Chloride: 108 mmol/L (ref 101–111)
GFR calc non Af Amer: >60 mL/min (ref >60)
Glucose, Bld: 136 mg/dL — ABNORMAL HIGH (ref 65–99)
Potassium: 3.5 mmol/L (ref 3.5–5.1)
SODIUM: 138 mmol/L (ref 135–145)
Total Bilirubin: 0.9 mg/dL (ref 0.3–1.2)
Total Protein: 7.6 g/dL (ref 6.5–8.1)

## 2016-05-05 LAB — CBC WITH DIFFERENTIAL/PLATELET
Basophils Absolute: 0 10*3/uL (ref 0.0–0.1)
Basophils Relative: 0 %
EOS PCT: 3 %
Eosinophils Absolute: 0.2 10*3/uL (ref 0.0–0.7)
HEMATOCRIT: 42.8 % (ref 36.0–46.0)
HEMOGLOBIN: 15 g/dL (ref 12.0–15.0)
LYMPHS PCT: 33 %
Lymphs Abs: 2.1 10*3/uL (ref 0.7–4.0)
MCH: 31 pg (ref 26.0–34.0)
MCHC: 35 g/dL (ref 30.0–36.0)
MCV: 88.4 fL (ref 78.0–100.0)
Monocytes Absolute: 0.3 10*3/uL (ref 0.1–1.0)
Monocytes Relative: 5 %
NEUTROS PCT: 59 %
Neutro Abs: 3.7 10*3/uL (ref 1.7–7.7)
Platelets: 298 10*3/uL (ref 150–400)
RBC: 4.84 MIL/uL (ref 3.87–5.11)
RDW: 12.7 % (ref 11.5–15.5)
WBC: 6.2 10*3/uL (ref 4.0–10.5)

## 2016-05-05 LAB — URINALYSIS, ROUTINE W REFLEX MICROSCOPIC
Bilirubin Urine: NEGATIVE
Glucose, UA: NEGATIVE mg/dL
Hgb urine dipstick: NEGATIVE
Ketones, ur: NEGATIVE mg/dL
LEUKOCYTES UA: NEGATIVE
Nitrite: NEGATIVE
PH: 5.5 (ref 5.0–8.0)
Protein, ur: NEGATIVE mg/dL

## 2016-05-05 LAB — TSH: TSH: 6.179 u[IU]/mL — ABNORMAL HIGH (ref 0.350–4.500)

## 2016-05-05 LAB — PREGNANCY, URINE: Preg Test, Ur: NEGATIVE

## 2016-05-05 LAB — TROPONIN I
TROPONIN I: 0.04 ng/mL — AB (ref <0.03)
TROPONIN I: 0.04 ng/mL — AB (ref <0.03)
TROPONIN I: 0.03 ng/mL — AB (ref <0.03)
Troponin I: 0.03 ng/mL (ref <0.03)

## 2016-05-05 LAB — PROTIME-INR
INR: 1.04
Prothrombin Time: 13.6 seconds (ref 11.4–15.2)

## 2016-05-05 LAB — LIPASE, BLOOD: Lipase: 18 U/L (ref 11–51)

## 2016-05-05 LAB — APTT: APTT: 30 s (ref 24–36)

## 2016-05-05 LAB — MAGNESIUM: MAGNESIUM: 2.1 mg/dL (ref 1.7–2.4)

## 2016-05-05 LAB — BRAIN NATRIURETIC PEPTIDE: B Natriuretic Peptide: 387 pg/mL — ABNORMAL HIGH (ref 0.0–100.0)

## 2016-05-05 LAB — MRSA PCR SCREENING: MRSA by PCR: POSITIVE — AB

## 2016-05-05 LAB — HCG, SERUM, QUALITATIVE: Preg, Serum: NEGATIVE

## 2016-05-05 LAB — T4, FREE: FREE T4: 1.13 ng/dL — AB (ref 0.61–1.12)

## 2016-05-05 MED ORDER — DILTIAZEM HCL 60 MG PO TABS
60.0000 mg | ORAL_TABLET | Freq: Four times a day (QID) | ORAL | Status: DC
  Administered 2016-05-05 – 2016-05-06 (×4): 60 mg via ORAL
  Filled 2016-05-05 (×4): qty 1

## 2016-05-05 MED ORDER — SODIUM CHLORIDE 0.9 % IV BOLUS (SEPSIS)
1000.0000 mL | Freq: Once | INTRAVENOUS | Status: AC
  Administered 2016-05-05: 1000 mL via INTRAVENOUS

## 2016-05-05 MED ORDER — FAMOTIDINE IN NACL 20-0.9 MG/50ML-% IV SOLN
20.0000 mg | Freq: Once | INTRAVENOUS | Status: AC
  Administered 2016-05-05: 20 mg via INTRAVENOUS
  Filled 2016-05-05: qty 50

## 2016-05-05 MED ORDER — ONDANSETRON HCL 4 MG/2ML IJ SOLN
4.0000 mg | Freq: Once | INTRAMUSCULAR | Status: AC
  Administered 2016-05-05: 4 mg via INTRAVENOUS

## 2016-05-05 MED ORDER — POTASSIUM CHLORIDE CRYS ER 20 MEQ PO TBCR
20.0000 meq | EXTENDED_RELEASE_TABLET | Freq: Every day | ORAL | Status: DC
  Administered 2016-05-05: 20 meq via ORAL
  Filled 2016-05-05: qty 1

## 2016-05-05 MED ORDER — NITROGLYCERIN 2 % TD OINT
1.0000 [in_us] | TOPICAL_OINTMENT | Freq: Once | TRANSDERMAL | Status: AC
  Administered 2016-05-05: 1 [in_us] via TOPICAL
  Filled 2016-05-05: qty 1

## 2016-05-05 MED ORDER — FUROSEMIDE 10 MG/ML IJ SOLN
40.0000 mg | Freq: Every day | INTRAMUSCULAR | Status: DC
  Administered 2016-05-05: 40 mg via INTRAVENOUS
  Filled 2016-05-05: qty 4

## 2016-05-05 MED ORDER — SODIUM CHLORIDE 0.9 % IV SOLN
INTRAVENOUS | Status: DC
  Administered 2016-05-05: 09:00:00 via INTRAVENOUS

## 2016-05-05 MED ORDER — APIXABAN 5 MG PO TABS
5.0000 mg | ORAL_TABLET | Freq: Two times a day (BID) | ORAL | Status: DC
  Administered 2016-05-05 (×2): 5 mg via ORAL
  Filled 2016-05-05 (×2): qty 1

## 2016-05-05 MED ORDER — AMIODARONE IV BOLUS ONLY 150 MG/100ML
150.0000 mg | Freq: Once | INTRAVENOUS | Status: AC
  Administered 2016-05-05: 150 mg via INTRAVENOUS
  Filled 2016-05-05: qty 100

## 2016-05-05 MED ORDER — DOCUSATE SODIUM 100 MG PO CAPS
100.0000 mg | ORAL_CAPSULE | Freq: Two times a day (BID) | ORAL | Status: DC
  Administered 2016-05-05 (×2): 100 mg via ORAL
  Filled 2016-05-05 (×2): qty 1

## 2016-05-05 MED ORDER — DILTIAZEM HCL 100 MG IV SOLR
5.0000 mg/h | INTRAVENOUS | Status: DC
  Administered 2016-05-05: 5 mg/h via INTRAVENOUS
  Filled 2016-05-05: qty 100

## 2016-05-05 MED ORDER — ENOXAPARIN SODIUM 100 MG/ML ~~LOC~~ SOLN: 1.0000 mg/kg | Freq: Two times a day (BID) | SUBCUTANEOUS | Status: DC

## 2016-05-05 MED ORDER — ALUM & MAG HYDROXIDE-SIMETH 200-200-20 MG/5ML PO SUSP
30.0000 mL | ORAL | Status: DC | PRN
Start: 2016-05-05 — End: 2016-05-06
  Administered 2016-05-05 – 2016-05-06 (×2): 30 mL via ORAL
  Filled 2016-05-05 (×2): qty 30

## 2016-05-05 MED ORDER — ACETAMINOPHEN 325 MG PO TABS
650.0000 mg | ORAL_TABLET | Freq: Once | ORAL | Status: AC
  Administered 2016-05-05: 650 mg via ORAL
  Filled 2016-05-05: qty 2

## 2016-05-05 MED ORDER — ONDANSETRON HCL 4 MG/2ML IJ SOLN
4.0000 mg | Freq: Once | INTRAMUSCULAR | Status: AC
  Administered 2016-05-05: 4 mg via INTRAVENOUS
  Filled 2016-05-05: qty 2

## 2016-05-05 MED ORDER — AMIODARONE HCL 150 MG/3ML IV SOLN: 150.0000 mg | Freq: Once | INTRAVENOUS | Status: DC

## 2016-05-05 MED ORDER — PANTOPRAZOLE SODIUM 40 MG PO TBEC
40.0000 mg | DELAYED_RELEASE_TABLET | Freq: Every day | ORAL | Status: DC
  Administered 2016-05-05: 40 mg via ORAL
  Filled 2016-05-05: qty 1

## 2016-05-05 MED ORDER — DIGOXIN 0.25 MG/ML IJ SOLN
0.1250 mg | Freq: Four times a day (QID) | INTRAMUSCULAR | Status: AC
  Administered 2016-05-05 (×2): 0.125 mg via INTRAVENOUS
  Filled 2016-05-05 (×2): qty 2

## 2016-05-05 MED ORDER — DILTIAZEM LOAD VIA INFUSION
20.0000 mg | Freq: Once | INTRAVENOUS | Status: AC
  Administered 2016-05-05: 20 mg via INTRAVENOUS
  Filled 2016-05-05: qty 20

## 2016-05-05 MED ORDER — SODIUM CHLORIDE 0.9 % IV BOLUS (SEPSIS)
500.0000 mL | Freq: Once | INTRAVENOUS | Status: AC
  Administered 2016-05-05: 500 mL via INTRAVENOUS

## 2016-05-05 MED ORDER — FUROSEMIDE 10 MG/ML IJ SOLN
40.0000 mg | Freq: Once | INTRAMUSCULAR | Status: AC
  Administered 2016-05-05: 40 mg via INTRAVENOUS
  Filled 2016-05-05: qty 4

## 2016-05-05 MED ORDER — ONDANSETRON HCL 4 MG/2ML IJ SOLN
INTRAMUSCULAR | Status: AC
  Filled 2016-05-05: qty 2

## 2016-05-05 MED ORDER — DILTIAZEM HCL 25 MG/5ML IV SOLN
10.0000 mg | Freq: Four times a day (QID) | INTRAVENOUS | Status: DC | PRN
  Administered 2016-05-05 (×2): 10 mg via INTRAVENOUS
  Filled 2016-05-05: qty 5

## 2016-05-05 MED ORDER — DILTIAZEM HCL 30 MG PO TABS
30.0000 mg | ORAL_TABLET | Freq: Four times a day (QID) | ORAL | Status: DC
  Administered 2016-05-05: 30 mg via ORAL
  Filled 2016-05-05: qty 1

## 2016-05-05 MED ORDER — FENTANYL CITRATE (PF) 100 MCG/2ML IJ SOLN
50.0000 ug | Freq: Once | INTRAMUSCULAR | Status: AC
  Administered 2016-05-05: 50 ug via INTRAVENOUS
  Filled 2016-05-05: qty 2

## 2016-05-05 MED ORDER — POTASSIUM CHLORIDE CRYS ER 20 MEQ PO TBCR
40.0000 meq | EXTENDED_RELEASE_TABLET | Freq: Once | ORAL | Status: AC
  Administered 2016-05-05: 40 meq via ORAL
  Filled 2016-05-05: qty 2

## 2016-05-05 MED ORDER — ALBUTEROL SULFATE (2.5 MG/3ML) 0.083% IN NEBU: 3.0000 mL | INHALATION_SOLUTION | RESPIRATORY_TRACT | Status: DC | PRN

## 2016-05-05 NOTE — Consult Note (Signed)
CARDIOLOGY CONSULT NOTE   Patient ID: Elaine [name] MRN: [id number] DOB/AGE: 49-Dec-1967 49 y.o.  Admit Date: 05/05/2016 Referring Physician: TRH-Singh  Primary Physician: [facility] Department Consulting Cardiologist:  Primary Cardiologist: New-Minka Knight Reason for Consultation: Atrial fib with RVR, Pulmonary edema, hypertension, and elevated troponin  Clinical Summary Elaine [name] is a 50 y.o.female admitted with abdominal pain and bloating, with CXR demonstrating pulmonary edema, and EKG documenting atrial fib with RVR.   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 Her health care is provided by the [facility], but she does not see them regularly.  She states she had eaten breakfast and was driving to work when she had a clenching feeling in her stomach, significant pressure. Did not radiate but she felt as if she had to burp and could not do so. When she arrived at work she checked her blood pressure and it was elevated at 160/118. She states that she calmed down because she became nervous about this, took some Pepcid and some Zantac and subcutaneous to help her with the abdominal gas. She states her blood pressure went up and down throughout the day. She said she began to feel her heart fluttering that evening. The pressure in her stomach stayed for a few hours and released. This came back a few more times that day.  The second day she felt her abdomen feeling full with pressure on and off, she also began to feel more fluttering in her chest. Her blood pressure was elevated, and she decided to come to the emergency room as the symptoms were unrelenting. She does admit that she had been feeling heart fluttering on and off which began about a year ago. She had been seen by the health Department and placed on metoprolol for hypertension that has not been taking it regularly.  On arrival to ER,  BP 160/108, HR 61 bpm, afebrile. BNP 387.0. She was not found to be anemic. Creatinine 0.81. EKG demonstrated atrial fib rate of 162 bpm with some ST depression. Troponin 0.04, 0.04 respectively.TSH 6.179.  CXR reported vascular congestion and mild cardiomegaly, increased interstitial markings, and concern for pulmonary edema.   She was treated with IV fluids, fentanyl, nitroglycerin 1 inch topically, given 20 mg IV diltiazem bolus and started on diltiazem drip at 10 mg an hour, and was also given IV Lasix 40 mg. Since admission she has diuresed 1.150 L. She has been started on ELIQUIS. Echocardiogram has been ordered. She remains on diltiazem at 5 mg an hour.   Allergies  Allergen Reactions  . Hydrocodone Hives, Itching and Nausea Only    Medications Scheduled Medications: . apixaban  5 mg Oral BID  . docusate sodium  100 mg Oral BID  . pantoprazole  40 mg Oral Daily    Infusions: . sodium chloride 10 mL/hr at 05/05/16 0841  . diltiazem (CARDIZEM) infusion 15 mg/hr (05/05/16 0700)    PRN Medications: albuterol, diltiazem   Past Medical History:  Diagnosis Date  . Anemia   . Endometriosis   . Fibroids   . GERD (gastroesophageal reflux disease)   . Hypertension   . Pancreatitis 09/2010    Past Surgical History:  Procedure Laterality Date  . ABDOMINAL HYSTERECTOMY    . LAPAROSCOPIC CHOLECYSTECTOMY  09/2010   [facility]  . LAPAROSCOPIC TUBAL LIGATION  1995  . SUPRACERVICAL ABDOMINAL HYSTERECTOMY  02/15/2012   Procedure: HYSTERECTOMY SUPRACERVICAL  ABDOMINAL;  Surgeon: Florian Buff, MD;  Location: [facility];  Service: Gynecology;  Laterality: N/A;  . TONSILLECTOMY     child  . TONSILLECTOMY    . TUBAL LIGATION      Family History  Problem Relation Age of Onset  . Non-Hodgkin's lymphoma Mother   . Heart attack Father   . Thyroid disease Sister      Social History Elaine [name] reports that she has never smoked. She has never used smokeless tobacco. Elaine [name] reports  that she does not drink alcohol.  Review of Systems Complete review of systems are found to be negative unless outlined in H&P above.  Physical Examination Blood pressure 129/85, pulse 70, temperature 97.8 F (36.6 C), temperature source Oral, resp. rate (!) 28, height 5\' 1"  (1.549 m), weight 260 lb 5.8 oz (118.1 kg), last menstrual period 02/14/2012, SpO2 95 %.  Intake/Output Summary (Last 24 hours) at 05/05/16 0939 Last data filed at 05/05/16 0841  Gross per 24 hour  Intake               50 ml  Output             1550 ml  Net            -1500 ml    Telemetry:atrial fibrillation rates of 200 beats per minute.  [id number], no acute distress HEENT: Conjunctiva and lids normal, oropharynx clear with moist mucosa. Neck: Supple, no elevated JVP or carotid bruits, no thyromegaly. Lungs: Clear to auscultation, nonlabored breathing at rest. Cardiac: Irregular rate, and rhythm, tachycardic,no S3 or significant systolic murmur, no pericardial rub. Abdomen: Soft, nontender, no hepatomegaly, bowel sounds present, no guarding or rebound. Extremities: No pitting edema, distal pulses 2+. Skin: Warm and dry. Musculoskeletal: No kyphosis. Neuropsychiatric: Alert and oriented x3, affect grossly appropriate.  Prior Cardiac Testing/Procedures None Lab Results  Basic Metabolic Panel:  Recent Labs Lab 05/05/16 0210 05/05/16 0522  NA 138  --   K 3.5  --   CL 108  --   CO2 22  --   GLUCOSE 136*  --   BUN 19  --   CREATININE 0.81  --   CALCIUM 8.8*  --   MG  --  2.1    Liver Function Tests:  Recent Labs Lab 05/05/16 0210  AST 19  ALT 29  ALKPHOS 62  BILITOT 0.9  PROT 7.6  ALBUMIN 4.2    CBC:  Recent Labs Lab 05/05/16 0210  WBC 6.2  NEUTROABS 3.7  HGB 15.0  HCT 42.8  MCV 88.4  PLT 298    Cardiac Enzymes:  Recent Labs Lab 05/05/16 0508 05/05/16 0716  TROPONINI 0.04* 0.04*    Radiology: Dg Abdomen Acute W/chest  Result Date: 05/05/2016 CLINICAL DATA:   Acute onset of generalized abdominal pain, bloating and nausea. Initial encounter. EXAM: DG ABDOMEN ACUTE W/ 1V CHEST COMPARISON:  None. FINDINGS: The lungs are well-aerated. Vascular congestion is noted. Increased interstitial markings raise concern for pulmonary edema. There is no evidence of pleural effusion or pneumothorax. The cardiomediastinal silhouette is mildly enlarged. The visualized bowel gas pattern is unremarkable. Scattered stool and air are seen within the colon; there is no evidence of small bowel dilatation to suggest obstruction. No free intra-abdominal air is identified on the provided upright view. Clips are noted within the right upper quadrant, reflecting prior cholecystectomy. Clips are also noted at the right hemipelvis. No acute osseous abnormalities are seen; the sacroiliac joints are unremarkable in appearance.  Mild degenerative change is noted at the pubic symphysis. IMPRESSION: 1. Unremarkable bowel gas pattern; no free intra-abdominal air seen. Small to moderate amount of stool noted in the colon. 2. Vascular congestion and mild cardiomegaly. Increased interstitial markings raise concern for pulmonary edema. Electronically Signed   By: Garald Balding M.D.   On: 05/05/2016 04:01     [id number] fibrillation with RVR rate 162 bpm some ST depression noted   Impression and Recommendations 1. Atrial fibrillation with RVR: She has been having episodes of fluttering and palpitations on and off beginning one year ago that she was able to actually feel, but is uncertain if  This has reoccurred and she had not known about it. The fluttering became more apparent for the last 2 days with associated abdominal pain and fullness.  She is currently on a diltiazem drip at 5 mg an hour with heart rate remaining around 100 beats-110 bpm during assessment, blood pressure is controlled currently. She was not found to be anemic, chemistries were within normal limits. TSH was elevated.   We'll  transition to by mouth diltiazem 30 mg every 6 hours.. Echocardiogram is pending. Agree with ELIQUIS. Case manager has provided paperwork for her to fill out for free medications and drug assistance as she has no insurance. The nature of Dr. Bronson Ing will be required to process paperwork. No ASA as she is on DOAC.  2. Hypertension: Hypertensive on admission, better controlled now on diltiazem drip.echocardiogram will give [place] more information concerning LV function and need to adjust medications.  3. Pulmonary edema: likely related to rapid heart rhythm. We'll review few echo for LV function. Responded to IV diuretic with 1.5  liters diuresised.Will continue IV lasix. Replace potassium   4. Elevated TSH:T3 and T4 will be ordered for follow-up.  5. Abdominal pain:history of chronic abdominal pain and gas with pancreatitis. She states that it felt very similar to this however the clenching feeling and pressure was a new. Question anginal equivalent.  7. Positive troponin:likely from demand ischemia. If echocardiogram is abnormal may need to proceed with ischemic testing with family history of heart disease. Cardiovascular risk factors include Family history, abnormal TSH, hypertension. Check lipid panel for risk stratification.       Signed: Phill Myron. Lawrence NP [place]  05/05/2016, 9:39 AM Co-Sign MD  The patient was seen and examined, and I agree with the history, physical exam, assessment and plan as documented above, with modifications as noted below. Pt with aforementioned history admitted with week long h/o palpitations and found to be in rapid atrial fibrillation. Says BP normally controlled with diet and exercise but hasn't participated in either in past 3 months. TSH mildly elevated, T3/T4 pending. Chest xray with mild pulmonary vascular congestion attributable to rapid atrial fibrillation. Troponins minimally elevated, consistent with demand ischemia. Currently on diltiazem infusion  15 mg/hr. Will attempt to transition to oral diltiazem. I will also give 150 mg IV amiodarone bolus x 1. CHADSVASC score is at least 1 (female) if not 2 (likely has suboptimally controlled HTN). For this reason, I think it is reasonable to anticoagulate with Eliquis. Echocardiogram should be performed only when HR < 100 bpm.  Kate Sable, MD, [facility]  05/05/2016 11:02 AM

## 2016-05-05 NOTE — ED Triage Notes (Signed)
Pt reports onset of ruq abd pressure that started on Tuesday.  Pt denies vomiting or diarrhea, states she feels bloated.

## 2016-05-05 NOTE — ED Notes (Signed)
CRITICAL VALUE ALERT  Critical value received:  Troponin 0.04  Date of notification:  05/05/16  Time of notification:  0600  Critical value read back:Yes.    Nurse who received alert:  bkn  MD notified (1st page):  knapp Time of first page:  0603  MD notified (2nd page):  Time of second page:  Responding MD:    Time MD responded:

## 2016-05-05 NOTE — Discharge Instructions (Addendum)
Follow with Primary MD  in 7 days and get future supply of Eliquis. Follow with recommended GI and cardiology physician also  Get CBC, CMP, TSH, 2 view Chest X ray checked  by Primary MD 7 days ( we routinely change or add medications that can affect your baseline labs and fluid status, therefore we recommend that you get the mentioned basic workup next visit with your PCP, your PCP may decide not to get them or add new tests based on their clinical decision)   Activity: As tolerated with Full fall precautions use walker/cane & assistance as needed   Disposition Home     Diet:   Heart Healthy  .  For Heart failure patients - Check your Weight same time everyday, if you gain over 2 pounds, or you develop in leg swelling, experience more shortness of breath or chest pain, call your Primary MD immediately. Follow Cardiac Low Salt Diet and 1.5 lit/day fluid restriction.   On your next visit with your primary care physician please Get Medicines reviewed and adjusted.   Please request your Prim.MD to go over all Hospital Tests and Procedure/Radiological results at the follow up, please get all Hospital records sent to your Prim MD by signing hospital release before you go home.   If you experience worsening of your admission symptoms, develop shortness of breath, life threatening emergency, suicidal or homicidal thoughts you must seek medical attention immediately by calling 911 or calling your MD immediately  if symptoms less severe.  You Must read complete instructions/literature along with all the possible adverse reactions/side effects for all the Medicines you take and that have been prescribed to you. Take any new Medicines after you have completely understood and accpet all the possible adverse reactions/side effects.   Do not drive, operate heavy machinery, perform activities at heights, swimming or participation in water activities or provide baby sitting services if your were admitted  for syncope or siezures until you have seen by Primary MD or a Neurologist and advised to do so again.  Do not drive when taking Pain medications.    Do not take more than prescribed Pain, Sleep and Anxiety Medications  Special Instructions: If you have smoked or chewed Tobacco  in the last 2 yrs please stop smoking, stop any regular Alcohol  and or any Recreational drug use.  Wear Seat belts while driving.   Please note  You were cared for by a hospitalist during your hospital stay. If you have any questions about your discharge medications or the care you received while you were in the hospital after you are discharged, you can call the unit and asked to speak with the hospitalist on call if the hospitalist that took care of you is not available. Once you are discharged, your primary care physician will handle any further medical issues. Please note that NO REFILLS for any discharge medications will be authorized once you are discharged, as it is imperative that you return to your primary care physician (or establish a relationship with a primary care physician if you do not have one) for your aftercare needs so that they can reassess your need for medications and monitor your lab values.       Information on my medicine - ELIQUIS (apixaban)  This medication education was reviewed with me or my healthcare representative as part of my discharge preparation.  The pharmacist that spoke with me during my hospital stay was:  Ena Dawley, [facility]  Why was Eliquis  prescribed for you? Eliquis was prescribed for you to reduce the risk of a blood clot forming that can cause a stroke if you have a medical condition called atrial fibrillation (a type of irregular heartbeat).  What do You need to know about Eliquis ? Take your Eliquis TWICE DAILY - one tablet in the morning and one tablet in the evening with or without food. If you have difficulty swallowing the tablet whole please discuss with  your pharmacist how to take the medication safely.  Take Eliquis exactly as prescribed by your doctor and DO NOT stop taking Eliquis without talking to the doctor who prescribed the medication.  Stopping may increase your risk of developing a stroke.  Refill your prescription before you run out.  After discharge, you should have regular check-up appointments with your healthcare provider that is prescribing your Eliquis.  In the future your dose may need to be changed if your kidney function or weight changes by a significant amount or as you get older.  What do you do if you miss a dose? If you miss a dose, take it as soon as you remember on the same day and resume taking twice daily.  Do not take more than one dose of ELIQUIS at the same time to make up a missed dose.  Important Safety Information A possible side effect of Eliquis is bleeding. You should call your healthcare provider right away if you experience any of the following: ? Bleeding from an injury or your nose that does not stop. ? Unusual colored urine (red or dark brown) or unusual colored stools (red or black). ? Unusual bruising for unknown reasons. ? A serious fall or if you hit your head (even if there is no bleeding).  Some medicines may interact with Eliquis and might increase your risk of bleeding or clotting while on Eliquis. To help avoid this, consult your healthcare provider or pharmacist prior to using any new prescription or non-prescription medications, including herbals, vitamins, non-steroidal anti-inflammatory drugs (NSAIDs) and supplements.  This website has more information on Eliquis (apixaban): http://www.eliquis.com/eliquis/home

## 2016-05-05 NOTE — ED Provider Notes (Signed)
[facility] DEPT Provider Note   CSN: [id number] Arrival date & time: 05/05/16  0146  Time seen 01:55 AM   History   Chief Complaint Chief Complaint  Patient presents with  . Abdominal Pain    HPI Elaine [name] is a 50 y.o. female.  HPI patient is status post cholecystectomy in 0000000 complicated by gallstone pancreatitis. She reports that on October 17 after she got ready for work and eaten a egg muffin with [place] sausage sandwich she started getting diffuse upper abdominal pain that she describes as a pressure with a dull pain in her right upper quadrant. She also complains of some diffuse chest soreness. She states the discomfort is calm and go and last up to 15 minutes at a time. She has however persistent feeling of bloating despite taking ranitidine and Gas-X. She states when the bloating gets worse she feels short of breath. She has had nausea without vomiting. She states she was able to eat a hamburger however the next morning she did have pain again. She has had some loss of appetite. She states burping helps relieve some of the pressure. She reports she had a GI evaluation about a year ago and they recommended she get a endoscopy done however she's reluctant to do endoscopy. She states when she had her gallstone pancreatitis they did endoscopy and she still has trouble swallowing since that study was done.  PCP none GI in [place]  Past Medical History:  Diagnosis Date  . Anemia   . Endometriosis   . Fibroids   . Hypertension   . Pancreatitis 09/2010    Patient Active Problem List   Diagnosis Date Noted  . New onset atrial fibrillation ([place]) 05/05/2016  . Pulmonary edema 05/05/2016  . Menorrhagia 12/02/2010  . Anemia 12/02/2010    Past Surgical History:  Procedure Laterality Date  . ABDOMINAL HYSTERECTOMY    . LAPAROSCOPIC CHOLECYSTECTOMY  09/2010   [facility]  . LAPAROSCOPIC TUBAL LIGATION  1995  . SUPRACERVICAL ABDOMINAL HYSTERECTOMY  02/15/2012   Procedure:  HYSTERECTOMY SUPRACERVICAL ABDOMINAL;  Surgeon: Florian Buff, MD;  Location: [facility];  Service: Gynecology;  Laterality: N/A;  . TONSILLECTOMY     child  . TONSILLECTOMY    . TUBAL LIGATION      OB History    Gravida Para Term Preterm AB Living   6 6 6  0 0 6   SAB TAB Ectopic Multiple Live Births   0 0 0 0         Home Medications    Prior to Admission medications   Medication Sig Start Date End Date Taking? Authorizing Provider  Biotin 5 MG TABS Take 5 mg by mouth daily.   Yes Historical Provider, MD  ibuprofen (ADVIL,MOTRIN) 200 MG tablet Take 2 tablets (400 mg total) by mouth every 6 (six) hours as needed. Patient taking differently: Take 400 mg by mouth every 6 (six) hours as needed for mild pain.  10/03/14  Yes Merryl Hacker, MD  metoprolol tartrate (LOPRESSOR) 25 MG tablet Take 12.5 mg by mouth daily.   Yes Historical Provider, MD  Multiple Vitamin (MULTIVITAMIN) capsule Take 1 capsule by mouth daily.   Yes Historical Provider, MD  pyridOXINE (VITAMIN B-6) 100 MG tablet Take 100 mg by mouth daily.   Yes Historical Provider, MD  albuterol (PROVENTIL HFA;VENTOLIN HFA) 108 (90 BASE) MCG/ACT inhaler Inhale 2 puffs into the lungs every 4 (four) hours as needed for wheezing or shortness of breath. 10/03/14  Merryl Hacker, MD  estrogens, conjugated, (PREMARIN) 0.3 MG tablet Take 1 tablet (0.3 mg total) by mouth daily. Take daily for 21 days then do not take for 7 days. 03/19/15   Evalee Jefferson, PA-C  ranitidine (ZANTAC) 150 MG capsule Take 1 capsule (150 mg total) by mouth 2 (two) times daily. Patient taking differently: Take 150 mg by mouth daily as needed for heartburn.  03/02/13   Milton Ferguson, MD  traMADol (ULTRAM) 50 MG tablet Take 1 tablet (50 mg total) by mouth every 6 (six) hours as needed for pain. Patient not taking: Reported on 10/03/2014 03/02/13   Milton Ferguson, MD    Family History Family History  Problem Relation Age of Onset  . Heart attack Father     Social  History Social History  Substance Use Topics  . Smoking status: Never Smoker  . Smokeless tobacco: Never Used  . Alcohol use No  employed as a Radiation protection practitioner  Allergies   Hydrocodone   Review of Systems Review of Systems  All other systems reviewed and are negative.    Physical Exam Updated Vital Signs BP (!) 160/108 (BP Location: Left Arm)   Pulse 61   Temp 97.8 F (36.6 C) (Oral)   Resp 20   Ht 5\' 1"  (1.549 m)   Wt 198 lb (89.8 kg)   LMP 02/14/2012   SpO2 98%   BMI 37.41 kg/m   Vital signs normal except for hypertension   Physical Exam  Constitutional: She is oriented to person, place, and time. She appears well-developed and well-nourished.  Non-toxic appearance. She does not appear ill. No distress.  HENT:  Head: Normocephalic and atraumatic.  Right Ear: External ear normal.  Left Ear: External ear normal.  Nose: Nose normal. No mucosal edema or rhinorrhea.  Mouth/Throat: Mucous membranes are normal. No dental abscesses or uvula swelling.  Dry tongue  Eyes: Conjunctivae and EOM are normal. Pupils are equal, round, and reactive to light.  Neck: Normal range of motion and full passive range of motion without pain. Neck supple.  Cardiovascular: Normal heart sounds.  An irregular rhythm present. Tachycardia present.  Exam reveals no gallop and no friction rub.   No murmur heard. Pulmonary/Chest: Effort normal and breath sounds normal. No respiratory distress. She has no wheezes. She has no rhonchi. She has no rales. She exhibits no tenderness and no crepitus.  Abdominal: Soft. Normal appearance and bowel sounds are normal. She exhibits distension. There is no tenderness. There is no rebound and no guarding.    She has diffuse upper abdominal tenderness but she's most painful in the right upper quadrant. She has active bowel sounds and has some mild distention.  Musculoskeletal: Normal range of motion. She exhibits no edema or tenderness.  Moves  all extremities well.   Neurological: She is alert and oriented to person, place, and time. She has normal strength. No cranial nerve deficit.  Skin: Skin is warm, dry and intact. No rash noted. No erythema. No pallor.  Psychiatric: She has a normal mood and affect. Her speech is normal and behavior is normal. Her mood appears not anxious.  Nursing note and vitals reviewed.    ED Treatments / Results  Labs (all labs ordered are listed, but only abnormal results are displayed) Results for orders placed or performed during the hospital encounter of 05/05/16  Comprehensive metabolic panel  Result Value Ref Range   Sodium 138 135 - 145 mmol/L   Potassium 3.5 3.5 -  5.1 mmol/L   Chloride 108 101 - 111 mmol/L   CO2 22 22 - 32 mmol/L   Glucose, Bld 136 (H) 65 - 99 mg/dL   BUN 19 6 - 20 mg/dL   Creatinine, Ser 0.81 0.44 - 1.00 mg/dL   Calcium 8.8 (L) 8.9 - 10.3 mg/dL   Total Protein 7.6 6.5 - 8.1 g/dL   Albumin 4.2 3.5 - 5.0 g/dL   AST 19 15 - 41 U/L   ALT 29 14 - 54 U/L   Alkaline Phosphatase 62 38 - 126 U/L   Total Bilirubin 0.9 0.3 - 1.2 mg/dL   GFR calc non Af Amer >60 >60 mL/min   GFR calc Af Amer >60 >60 mL/min   Anion gap 8 5 - 15  Lipase, blood  Result Value Ref Range   Lipase 18 11 - 51 U/L  CBC with Differential  Result Value Ref Range   WBC 6.2 4.0 - 10.5 K/uL   RBC 4.84 3.87 - 5.11 MIL/uL   Hemoglobin 15.0 12.0 - 15.0 g/dL   HCT 42.8 36.0 - 46.0 %   MCV 88.4 78.0 - 100.0 fL   MCH 31.0 26.0 - 34.0 pg   MCHC 35.0 30.0 - 36.0 g/dL   RDW 12.7 11.5 - 15.5 %   Platelets 298 150 - 400 K/uL   Neutrophils Relative % 59 %   Neutro Abs 3.7 1.7 - 7.7 K/uL   Lymphocytes Relative 33 %   Lymphs Abs 2.1 0.7 - 4.0 K/uL   Monocytes Relative 5 %   Monocytes Absolute 0.3 0.1 - 1.0 K/uL   Eosinophils Relative 3 %   Eosinophils Absolute 0.2 0.0 - 0.7 K/uL   Basophils Relative 0 %   Basophils Absolute 0.0 0.0 - 0.1 K/uL  Pregnancy, urine  Result Value Ref Range   Preg Test, Ur  NEGATIVE NEGATIVE  Urinalysis, Routine w reflex microscopic  Result Value Ref Range   Color, Urine YELLOW YELLOW   APPearance CLEAR CLEAR   Specific Gravity, Urine >1.030 (H) 1.005 - 1.030   pH 5.5 5.0 - 8.0   Glucose, UA NEGATIVE NEGATIVE mg/dL   Hgb urine dipstick NEGATIVE NEGATIVE   Bilirubin Urine NEGATIVE NEGATIVE   Ketones, ur NEGATIVE NEGATIVE mg/dL   Protein, ur NEGATIVE NEGATIVE mg/dL   Nitrite NEGATIVE NEGATIVE   Leukocytes, UA NEGATIVE NEGATIVE  hCG, serum, qualitative  Result Value Ref Range   Preg, Serum NEGATIVE NEGATIVE  Brain natriuretic peptide  Result Value Ref Range   B Natriuretic Peptide 387.0 (H) 0.0 - 100.0 pg/mL  Troponin I  Result Value Ref Range   Troponin I 0.04 (HH) <0.03 ng/mL  TSH  Result Value Ref Range   TSH 6.179 (H) 0.350 - 4.500 uIU/mL  Protime-INR  Result Value Ref Range   Prothrombin Time 13.6 11.4 - 15.2 seconds   INR 1.04   APTT  Result Value Ref Range   aPTT 30 24 - 36 seconds  Magnesium  Result Value Ref Range   Magnesium 2.1 1.7 - 2.4 mg/dL   Laboratory interpretation all normal except  Elevated TSH consistent with hypothyroidism (would expect hyperthyroidism with acute atrial fibrillation), minimally elevated BNP, mildly elevated troponin c/w arhythmia    EKG  EKG Interpretation  Date/Time:  Thursday May 05 2016 04:43:16 EDT Ventricular Rate:  167 PR Interval:    QRS Duration: 90 QT Interval:  317 QTC Calculation: 529 R Axis:   0 Text Interpretation:  Atrial fibrillation Low voltage, extremity leads  Minimal ST depression Prolonged QT interval Since last tracing rate faster 30 Sep 2008 Atrial fibrillation has replaced Normal sinus rhythm Confirmed by [place]  MD-I, Kadarrius Yanke ([id number]) on 05/05/2016 4:48:27 AM       Radiology Dg Abdomen Acute W/chest  Result Date: 05/05/2016 CLINICAL DATA:  Acute onset of generalized abdominal pain, bloating and nausea. Initial encounter. EXAM: DG ABDOMEN ACUTE W/ 1V CHEST COMPARISON:   None. FINDINGS: The lungs are well-aerated. Vascular congestion is noted. Increased interstitial markings raise concern for pulmonary edema. There is no evidence of pleural effusion or pneumothorax. The cardiomediastinal silhouette is mildly enlarged. The visualized bowel gas pattern is unremarkable. Scattered stool and air are seen within the colon; there is no evidence of small bowel dilatation to suggest obstruction. No free intra-abdominal air is identified on the provided upright view. Clips are noted within the right upper quadrant, reflecting prior cholecystectomy. Clips are also noted at the right hemipelvis. No acute osseous abnormalities are seen; the sacroiliac joints are unremarkable in appearance. Mild degenerative change is noted at the pubic symphysis. IMPRESSION: 1. Unremarkable bowel gas pattern; no free intra-abdominal air seen. Small to moderate amount of stool noted in the colon. 2. Vascular congestion and mild cardiomegaly. Increased interstitial markings raise concern for pulmonary edema. Electronically Signed   By: Garald Balding M.D.   On: 05/05/2016 04:01    Procedures Procedures (including critical care time)  Medications Ordered in ED Medications  diltiazem (CARDIZEM) 1 mg/mL load via infusion 20 mg (20 mg Intravenous Bolus from Bag 05/05/16 0508)    And  diltiazem (CARDIZEM) 100 mg in dextrose 5 % 100 mL (1 mg/mL) infusion (10 mg/hr Intravenous Rate/Dose Change 05/05/16 0553)  sodium chloride 0.9 % bolus 1,000 mL (0 mLs Intravenous Stopped 05/05/16 0510)  sodium chloride 0.9 % bolus 1,000 mL (0 mLs Intravenous Stopped 05/05/16 0420)  fentaNYL (SUBLIMAZE) injection 50 mcg (50 mcg Intravenous Given 05/05/16 0223)  ondansetron (ZOFRAN) injection 4 mg (4 mg Intravenous Given 05/05/16 0223)  famotidine (PEPCID) IVPB 20 mg premix (0 mg Intravenous Stopped 05/05/16 0307)  sodium chloride 0.9 % bolus 1,000 mL (0 mLs Intravenous Stopped 05/05/16 0449)  ondansetron (ZOFRAN)  injection 4 mg (4 mg Intravenous Given 05/05/16 0401)  nitroGLYCERIN (NITROGLYN) 2 % ointment 1 inch (1 inch Topical Given 05/05/16 0502)  acetaminophen (TYLENOL) tablet 650 mg (650 mg Oral Given 05/05/16 0500)  furosemide (LASIX) injection 40 mg (40 mg Intravenous Given 05/05/16 0547)     Initial Impression / Assessment and Plan / ED Course  I have reviewed the triage vital signs and the nursing notes.  Pertinent labs & imaging results that were available during my care of the patient were reviewed by me and considered in my medical decision making (see chart for details).  Clinical Course   Patient was given IV fluids and IV pain and nausea medication she was given IV Pepcid. Had she not already had her gallbladder removed her pain was suggestive of acute gallstones.  03:20 AM discussed her lab results, we also discussed getting abdominal xrays to look for signs of bowel obstruction. She states she has had that in the past. She did not require an NG tube.   04:20 AM review of her prior testing here and at [facility] shows she has had 4 [facility] CT scans(2015, 2014 here, and 2 in 2013) that did not show obstruction and mainly just air in bile duct s/p surgery. Pt is sitting up in bed, states she feels SOB and  has to sit up, laying down makes it worse. Denies swelling of feet.  Denies prior heart problems. Was on metoprolol in the past for BP. Will add EKG, BNP and troponin to testing. Discussed her AAS which showed increased pulmonary vascularity and no acute abdominal findings. Family history is negative for irregular heartbeat.  Pt's EKG shows afib with FVR. Pt now states she had a lot of fluttering 2 days ago and not much yesterday. Will start on diltiazem bolus and drip. Troponin, BNP and TSH added to blood work. Discussed admission.   05:20 AM after diltiazem bolus and on drip, HR now 100-110, still in afib  05:33 AM Dr Darrick Meigs, admit to observation, step down  Final Clinical Impressions(s)  / ED Diagnoses   Final diagnoses:  Abdominal bloating  Dyspnea, unspecified type  New onset atrial fibrillation ([place])  Chest pain, unspecified type    Plan admission  Rolland Porter, MD, [place] Performed by: Grey Schlauch L Lari Linson Total critical care time: 32  minutes Critical care time was exclusive of separately billable procedures and treating other patients. Critical care was necessary to treat or prevent imminent or life-threatening deterioration. Critical care was time spent personally by me on the following activities: development of treatment plan with patient and/or surrogate as well as nursing, discussions with consultants, evaluation of patient's response to treatment, examination of patient, obtaining history from patient or surrogate, ordering and performing treatments and interventions, ordering and review of laboratory studies, ordering and review of radiographic studies, pulse oximetry and re-evaluation of patient's condition.     Rolland Porter, MD 05/05/16 [phone]

## 2016-05-05 NOTE — Progress Notes (Addendum)
PROGRESS NOTE                                                                                                                                                                                                             Patient Demographics:    Elaine [name], is a 50 y.o. female, DOB - 03-Sep-1965, [id number]  Admit date - 05/05/2016   Admitting Physician Oswald Hillock, MD  Outpatient Primary MD for the patient is No PCP Per Patient  LOS - 0  Chief Complaint  Patient presents with  . Abdominal Pain       Brief Narrative    Elaine [name]  is a 50 y.o. female, With history of hypertension, status post cholecystectomy in 0000000 complicated by gallstone pancreatitis came to the hospital with abdominal pain, bloating. Also has been complaining of intermittent palpitations with shortness of breath. In the ED x-ray of the chest and abdomen, showed nonobstructive bowel gas pattern and possible pulmonary edema.   BNP was obtained which was 348, EKG showed patient to be in A. fib with RVR. In the ED patient started on IV Cardizem infusion, 1 dose of Lasix 40 mg IV given in the ED.  Patient denies chest pain, only intermittent shortness of breath and orthopnea. No nausea vomiting or diarrhea. No dysuria. No fever    Subjective:    Elaine [name] today has, No headache, No chest pain, No abdominal pain - No Nausea, No new weakness tingling or numbness, No Cough - SOB.     Assessment  & Plan :     1.Newly diagnosed atrial fibrillation with RVR likely paroxysmal. Goal will be rate control, rate controlled on Cardizem, [place] vasc 2 score of 2 and placed on Eliquis. Case management to provide one-month supply and after that PCP to arrange for future supply. TSH slightly elevated, echocardiogram pending.  2. Mild pulmonary edema secondary to RVR, resolved with Lasix.  3. Reflux. Placed on PPI.  4. Hypertension. Continue Cardizem and  monitor.  5. Morbid obesity. Follow with PCP for weight loss.  6. Troponins minimally elevated - flat non ACS pattern consistent with demand ischemia from #1. Check TTE.  7. Mild sick Euthyroid - repeat TSH in 4 weeks.    Family Communication  :  None  Code Status :  Full  Diet : Heart Healthy  Disposition Plan  :  Tele  Consults  :  Cards  Procedures  :  TTE  DVT Prophylaxis  :  Eliquis  Lab Results  Component Value Date   PLT 298 05/05/2016    Inpatient Medications  Scheduled Meds: . apixaban  5 mg Oral BID  . diltiazem  30 mg Oral Q6H  . docusate sodium  100 mg Oral BID  . furosemide  40 mg Intravenous Daily  . pantoprazole  40 mg Oral Daily  . potassium chloride  20 mEq Oral Daily   Continuous Infusions: . sodium chloride 10 mL/hr at 05/05/16 0841   PRN Meds:.albuterol, diltiazem  Antibiotics  :    Anti-infectives    None         Objective:   Vitals:   05/05/16 0630 05/05/16 0725 05/05/16 0736 05/05/16 0800  BP: 123/90   129/85  Pulse: 98 (!) 113  70  Resp: 18 (!) 21  (!) 28  Temp:  97.8 F (36.6 C)    TempSrc:  Oral    SpO2: 94% 96%  95%  Weight:   118.1 kg (260 lb 5.8 oz)   Height:   5\' 1"  (1.549 m)     Wt Readings from Last 3 Encounters:  05/05/16 118.1 kg (260 lb 5.8 oz)  03/19/15 89.8 kg (198 lb)  10/03/14 89.8 kg (198 lb)     Intake/Output Summary (Last 24 hours) at 05/05/16 1044 Last data filed at 05/05/16 0841  Gross per 24 hour  Intake               50 ml  Output             1550 ml  Net            -1500 ml     Physical Exam  Awake Alert, Oriented X 3, No new F.N deficits, Normal affect [place].AT,PERRAL Supple Neck,No JVD, No cervical lymphadenopathy appriciated.  Symmetrical Chest wall movement, Good air movement bilaterally, CTAB iRRR,No Gallops,Rubs or new Murmurs, No Parasternal Heave +ve B.Sounds, Abd Soft, No tenderness, No organomegaly appriciated, No rebound - guarding or rigidity. No Cyanosis, Clubbing or  edema, No new Rash or bruise      Data Review:    CBC  Recent Labs Lab 05/05/16 0210  WBC 6.2  HGB 15.0  HCT 42.8  PLT 298  MCV 88.4  MCH 31.0  MCHC 35.0  RDW 12.7  LYMPHSABS 2.1  MONOABS 0.3  EOSABS 0.2  BASOSABS 0.0    Chemistries   Recent Labs Lab 05/05/16 0210 05/05/16 0522  NA 138  --   K 3.5  --   CL 108  --   CO2 22  --   GLUCOSE 136*  --   BUN 19  --   CREATININE 0.81  --   CALCIUM 8.8*  --   MG  --  2.1  AST 19  --   ALT 29  --   ALKPHOS 62  --   BILITOT 0.9  --    ------------------------------------------------------------------------------------------------------------------ No results for input(s): CHOL, HDL, LDLCALC, TRIG, CHOLHDL, LDLDIRECT in the last 72 hours.  Lab Results  Component Value Date   HGBA1C  09/20/2010    5.4 (NOTE)  According to the ADA Clinical Practice Recommendations for 2011, when HbA1c is used as a screening test:   >=6.5%   Diagnostic of Diabetes Mellitus           (if abnormal result  is confirmed)  5.7-6.4%   Increased risk of developing Diabetes Mellitus  References:Diagnosis and Classification of Diabetes Mellitus,Diabetes [id number] 1):S62-S69 and Standards of Medical Care in         Diabetes - 2011,Diabetes [id number]  (Suppl 1):S11-S61.   ------------------------------------------------------------------------------------------------------------------  Recent Labs  05/05/16 0508  TSH 6.179*   ------------------------------------------------------------------------------------------------------------------ No results for input(s): VITAMINB12, FOLATE, FERRITIN, TIBC, IRON, RETICCTPCT in the last 72 hours.  Coagulation profile  Recent Labs Lab 05/05/16 0508  INR 1.04    No results for input(s): DDIMER in the last 72 hours.  Cardiac Enzymes  Recent Labs Lab 05/05/16 0508 05/05/16 0716  TROPONINI 0.04* 0.04*    ------------------------------------------------------------------------------------------------------------------    Component Value Date/Time   BNP 387.0 (H) 05/05/2016 [id number]    Micro Results No results found for this or any previous visit (from the past 240 hour(s)).  Radiology Reports Dg Abdomen Acute W/chest  Result Date: 05/05/2016 CLINICAL DATA:  Acute onset of generalized abdominal pain, bloating and nausea. Initial encounter. EXAM: DG ABDOMEN ACUTE W/ 1V CHEST COMPARISON:  None. FINDINGS: The lungs are well-aerated. Vascular congestion is noted. Increased interstitial markings raise concern for pulmonary edema. There is no evidence of pleural effusion or pneumothorax. The cardiomediastinal silhouette is mildly enlarged. The visualized bowel gas pattern is unremarkable. Scattered stool and air are seen within the colon; there is no evidence of small bowel dilatation to suggest obstruction. No free intra-abdominal air is identified on the provided upright view. Clips are noted within the right upper quadrant, reflecting prior cholecystectomy. Clips are also noted at the right hemipelvis. No acute osseous abnormalities are seen; the sacroiliac joints are unremarkable in appearance. Mild degenerative change is noted at the pubic symphysis. IMPRESSION: 1. Unremarkable bowel gas pattern; no free intra-abdominal air seen. Small to moderate amount of stool noted in the colon. 2. Vascular congestion and mild cardiomegaly. Increased interstitial markings raise concern for pulmonary edema. Electronically Signed   By: Garald Balding M.D.   On: 05/05/2016 04:01    Time Spent in minutes  30   Durinda Buzzelli K M.D on 05/05/2016 at 10:44 AM  Between 7am to 7pm - Pager - [phone]  After 7pm go to [email or web address] - password [facility]  [facility] -  Office  [phone]

## 2016-05-05 NOTE — H&P (Signed)
TRH H&P    Patient Demographics:    Elaine [name], is a 50 y.o. female  MRN: [id number]  DOB - 04/21/1966  Admit Date - 05/05/2016  Referring MD/NP/PA: Dr. Tomi Bamberger  Outpatient Primary MD for the patient is No PCP Per Patient  Patient coming from: Home  Chief Complaint  Patient presents with  . Abdominal Pain      HPI:    Elaine [name]  is a 50 y.o. female, With history of hypertension, status post cholecystectomy in 0000000 complicated by gallstone pancreatitis came to the hospital with abdominal pain, bloating. Also has been complaining of intermittent palpitations with shortness of breath. In the ED x-ray of the chest and abdomen, showed nonobstructive bowel gas pattern and possible pulmonary edema.   BNP was obtained which was 348, EKG showed patient to be in A. fib with RVR. In the ED patient started on IV Cardizem infusion, 1 dose of Lasix 40 mg IV given in the ED.  Patient denies chest pain, only intermittent shortness of breath and orthopnea. No nausea vomiting or diarrhea. No dysuria. No fever   Review of systems:    In addition to the HPI above,  No Fever-chills, No Headache, No changes with Vision or hearing, No problems swallowing food or Liquids, No dysuria, No new skin rashes or bruises, No new joints pains-aches,  No new weakness, tingling, numbness in any extremity, No recent weight gain or loss, No polyuria, polydypsia or polyphagia, No significant Mental Stressors.  A full 10 point Review of Systems was done, except as stated above, all other Review of Systems were negative.   With Past History of the following :    Past Medical History:  Diagnosis Date  . Anemia   . Endometriosis   . Fibroids   . Hypertension   . Pancreatitis 09/2010      Past Surgical History:  Procedure Laterality Date  . ABDOMINAL HYSTERECTOMY    . LAPAROSCOPIC CHOLECYSTECTOMY  09/2010   [facility]    . LAPAROSCOPIC TUBAL LIGATION  1995  . SUPRACERVICAL ABDOMINAL HYSTERECTOMY  02/15/2012   Procedure: HYSTERECTOMY SUPRACERVICAL ABDOMINAL;  Surgeon: Florian Buff, MD;  Location: [facility];  Service: Gynecology;  Laterality: N/A;  . TONSILLECTOMY     child  . TONSILLECTOMY    . TUBAL LIGATION        Social History:      Social History  Substance Use Topics  . Smoking status: Never Smoker  . Smokeless tobacco: Never Used  . Alcohol use No       Family History :     Family History  Problem Relation Age of Onset  . Heart attack Father       Home Medications:   Prior to Admission medications   Medication Sig Start Date End Date Taking? Authorizing Provider  Biotin 5 MG TABS Take 5 mg by mouth daily.   Yes Historical Provider, MD  ibuprofen (ADVIL,MOTRIN) 200 MG tablet Take 2 tablets (400 mg total) by mouth every 6 (six) hours as needed. Patient taking differently:  Take 400 mg by mouth every 6 (six) hours as needed for mild pain.  10/03/14  Yes Merryl Hacker, MD  metoprolol tartrate (LOPRESSOR) 25 MG tablet Take 12.5 mg by mouth daily.   Yes Historical Provider, MD  Multiple Vitamin (MULTIVITAMIN) capsule Take 1 capsule by mouth daily.   Yes Historical Provider, MD  pyridOXINE (VITAMIN B-6) 100 MG tablet Take 100 mg by mouth daily.   Yes Historical Provider, MD  albuterol (PROVENTIL HFA;VENTOLIN HFA) 108 (90 BASE) MCG/ACT inhaler Inhale 2 puffs into the lungs every 4 (four) hours as needed for wheezing or shortness of breath. 10/03/14   Merryl Hacker, MD  estrogens, conjugated, (PREMARIN) 0.3 MG tablet Take 1 tablet (0.3 mg total) by mouth daily. Take daily for 21 days then do not take for 7 days. 03/19/15   Evalee Jefferson, PA-C  ranitidine (ZANTAC) 150 MG capsule Take 1 capsule (150 mg total) by mouth 2 (two) times daily. Patient taking differently: Take 150 mg by mouth daily as needed for heartburn.  03/02/13   Milton Ferguson, MD  traMADol (ULTRAM) 50 MG tablet Take 1 tablet (50  mg total) by mouth every 6 (six) hours as needed for pain. Patient not taking: Reported on 10/03/2014 03/02/13   Milton Ferguson, MD     Allergies:     Allergies  Allergen Reactions  . Hydrocodone Hives, Itching and Nausea Only     Physical Exam:   Vitals  Blood pressure 116/95, pulse 94, temperature 97.8 F (36.6 C), temperature source Oral, resp. rate 16, height 5\' 1"  (1.549 m), weight 89.8 kg (198 lb), last menstrual period 02/14/2012, SpO2 92 %.  1.  General: Morbidly obese female in no acute distress  2. Psychiatric:  Intact judgement and  insight, awake alert, oriented x 3.  3. Neurologic: No focal neurological deficits, all cranial nerves intact.Strength 5/5 all 4 extremities, sensation intact all 4 extremities, plantars down going.  4. Eyes :  anicteric sclerae, moist conjunctivae with no lid lag. PERRLA.  5. ENMT:  Oropharynx clear with moist mucous membranes and good dentition  6. Neck:  supple, no cervical lymphadenopathy appriciated, No thyromegaly  7. Respiratory : Faint bibasilar crackles at lung bases  8. Cardiovascular : RRR, no gallops, rubs or murmurs, no leg edema  9. Gastrointestinal:  Positive bowel sounds, abdomen soft, non-tender to palpation,no hepatosplenomegaly, no rigidity or guarding       10. Skin:  No cyanosis, normal texture and turgor, no rash, lesions or ulcers  11.Musculoskeletal:  Good muscle tone,  joints appear normal , no effusions,  normal range of motion    Data Review:    CBC  Recent Labs Lab 05/05/16 0210  WBC 6.2  HGB 15.0  HCT 42.8  PLT 298  MCV 88.4  MCH 31.0  MCHC 35.0  RDW 12.7  LYMPHSABS 2.1  MONOABS 0.3  EOSABS 0.2  BASOSABS 0.0   ------------------------------------------------------------------------------------------------------------------  Chemistries   Recent Labs Lab 05/05/16 0210 05/05/16 0522  NA 138  --   K 3.5  --   CL 108  --   CO2 22  --   GLUCOSE 136*  --   BUN 19  --     CREATININE 0.81  --   CALCIUM 8.8*  --   MG  --  2.1  AST 19  --   ALT 29  --   ALKPHOS 62  --   BILITOT 0.9  --    ------------------------------------------------------------------------------------------------------------------  ------------------------------------------------------------------------------------------------------------------ GFR: Estimated Creatinine  Clearance: 85.7 mL/min (by C-G formula based on SCr of 0.81 mg/dL). Liver Function Tests:  Recent Labs Lab 05/05/16 0210  AST 19  ALT 29  ALKPHOS 62  BILITOT 0.9  PROT 7.6  ALBUMIN 4.2    Recent Labs Lab 05/05/16 0210  LIPASE 18   No results for input(s): AMMONIA in the last 168 hours. Coagulation Profile:  Recent Labs Lab 05/05/16 0508  INR 1.04   Thyroid Function Tests:  Recent Labs  05/05/16 0508  TSH 6.179*   Anemia Panel: No results for input(s): VITAMINB12, FOLATE, FERRITIN, TIBC, IRON, RETICCTPCT in the last 72 hours.  --------------------------------------------------------------------------------------------------------------- Urine analysis:    Component Value Date/Time   COLORURINE YELLOW 05/05/2016 0337   APPEARANCEUR CLEAR 05/05/2016 0337   LABSPEC >1.030 (H) 05/05/2016 0337   PHURINE 5.5 05/05/2016 [place] 05/05/2016 0337   HGBUR NEGATIVE 05/05/2016 0337   BILIRUBINUR NEGATIVE 05/05/2016 [place] 05/05/2016 0337   PROTEINUR NEGATIVE 05/05/2016 0337   UROBILINOGEN 0.2 03/19/2015 1837   NITRITE NEGATIVE 05/05/2016 0337   LEUKOCYTESUR NEGATIVE 05/05/2016 [id number]      Imaging Results:    Dg Abdomen Acute W/chest  Result Date: 05/05/2016 CLINICAL DATA:  Acute onset of generalized abdominal pain, bloating and nausea. Initial encounter. EXAM: DG ABDOMEN ACUTE W/ 1V CHEST COMPARISON:  None. FINDINGS: The lungs are well-aerated. Vascular congestion is noted. Increased interstitial markings raise concern for pulmonary edema. There is no  evidence of pleural effusion or pneumothorax. The cardiomediastinal silhouette is mildly enlarged. The visualized bowel gas pattern is unremarkable. Scattered stool and air are seen within the colon; there is no evidence of small bowel dilatation to suggest obstruction. No free intra-abdominal air is identified on the provided upright view. Clips are noted within the right upper quadrant, reflecting prior cholecystectomy. Clips are also noted at the right hemipelvis. No acute osseous abnormalities are seen; the sacroiliac joints are unremarkable in appearance. Mild degenerative change is noted at the pubic symphysis. IMPRESSION: 1. Unremarkable bowel gas pattern; no free intra-abdominal air seen. Small to moderate amount of stool noted in the colon. 2. Vascular congestion and mild cardiomegaly. Increased interstitial markings raise concern for pulmonary edema. Electronically Signed   By: Garald Balding M.D.   On: 05/05/2016 04:01    My personal review of EKG: Rhythm atrial fibrillation with RVR   Assessment & Plan:    Active Problems:   New onset atrial fibrillation (HCC)   Pulmonary edema   1. New onset atrial fibrillation- place under observation, continue IV Cardizem infusion, CHA2DS2VASc score is 2, will start anticoagulation with Lovenox. We'll consult cardiology in a.m. obtain echocardiogram, serial troponin. TSH is 6.179, will obtain free T4 and T3. Will check hemoglobin A1c 2. Mild elevation of troponin- troponin 0.04 in the ED, likely from demand ischemia from above. Will obtain serial troponin as above 3. Pulmonary edema- chest x-ray shows interstitial edema, BNP is 387. Lasix 1 dose 40 mg given in the ED. Will obtain echocardiogram 4. Hypertension- patient recently came off metoprolol, will likely need an adjustment in antihypertensive regimen at the time of discharge. 5. Abdominal pain/bloating- resolved, abdominal x-ray showed moderate stool. Start Colace 100 mg twice a day.   DVT  Prophylaxis-   Lovenox   AM Labs Ordered, also please review Full Orders  Family Communication: No family at bedside.  Admission, patients condition and plan of care including tests being ordered have been discussed with the patient  who indicate understanding and  agree with the plan and Code Status.  Code Status:  Full code  Admission status: Observation    Time spent in minutes : 60 minutes   Jerren Flinchbaugh S M.D on 05/05/2016 at 5:57 AM  Between 7am to 7pm - Pager - [phone]. After 7pm go to [email or web address] - password [facility]  [facility] - Office  [phone]

## 2016-05-05 NOTE — Care Management Note (Signed)
Case Management Note  Patient Details  Name: Elaine [name] MRN: [id number] Date of Birth: 05-06-1966  Subjective/Objective:                  Pt admitted with A-fib. Pt is from home, lives with daughter and is ind with ADL's. Pt is employed but has no insurance. She recieves PCP care at the [facility]. She will be prescribed Eliquis. 30-day free-voucher given and CM assisted pt with filling out financial assistance application from [employer]. Pt plans to return home with self care at DC. Pt will f/u with cardiology.   Action/Plan: No further needs.   Expected Discharge Date:    05/06/2016              Expected Discharge Plan:  Home/Self Care  In-House Referral:  NA  Discharge planning Services  CM Consult, Medication Assistance  Post Acute Care Choice:  NA Choice offered to:  NA   Status of Service: Completed.   Sherald Barge, RN 05/05/2016, 2:11 PM

## 2016-05-06 ENCOUNTER — Inpatient Hospital Stay (HOSPITAL_COMMUNITY): Payer: 59

## 2016-05-06 DIAGNOSIS — I4891 Unspecified atrial fibrillation: Secondary | ICD-10-CM | POA: Diagnosis not present

## 2016-05-06 DIAGNOSIS — R079 Chest pain, unspecified: Secondary | ICD-10-CM | POA: Diagnosis not present

## 2016-05-06 DIAGNOSIS — R140 Abdominal distension (gaseous): Secondary | ICD-10-CM | POA: Diagnosis not present

## 2016-05-06 DIAGNOSIS — I509 Heart failure, unspecified: Secondary | ICD-10-CM | POA: Diagnosis not present

## 2016-05-06 DIAGNOSIS — Z7901 Long term (current) use of anticoagulants: Secondary | ICD-10-CM

## 2016-05-06 DIAGNOSIS — Z5181 Encounter for therapeutic drug level monitoring: Secondary | ICD-10-CM

## 2016-05-06 DIAGNOSIS — I10 Essential (primary) hypertension: Secondary | ICD-10-CM | POA: Diagnosis not present

## 2016-05-06 DIAGNOSIS — R0600 Dyspnea, unspecified: Secondary | ICD-10-CM | POA: Diagnosis not present

## 2016-05-06 LAB — COMPREHENSIVE METABOLIC PANEL
ALK PHOS: 56 U/L (ref 38–126)
ALT: 22 U/L (ref 14–54)
AST: 17 U/L (ref 15–41)
Albumin: 3.5 g/dL (ref 3.5–5.0)
Anion gap: 5 (ref 5–15)
BILIRUBIN TOTAL: 1.1 mg/dL (ref 0.3–1.2)
BUN: 11 mg/dL (ref 6–20)
CALCIUM: 8.3 mg/dL — AB (ref 8.9–10.3)
CO2: 25 mmol/L (ref 22–32)
CREATININE: 0.68 mg/dL (ref 0.44–1.00)
Chloride: 108 mmol/L (ref 101–111)
Glucose, Bld: 136 mg/dL — ABNORMAL HIGH (ref 65–99)
Potassium: 3.5 mmol/L (ref 3.5–5.1)
Sodium: 138 mmol/L (ref 135–145)
Total Protein: 6.6 g/dL (ref 6.5–8.1)

## 2016-05-06 LAB — ECHOCARDIOGRAM COMPLETE
AO mean calculated velocity dopler: 99.9 cm/s
AOPV: 0.9 m/s
AOVTI: 37.5 cm
AV Area VTI index: 1.04 cm2/m2
AV Area VTI: 2.81 cm2
AV Area mean vel: 2.72 cm2
AV area mean vel ind: 1.18 cm2/m2
AVA: 2.39 cm2
AVCELMEANRAT: 0.87
AVG: 5 mmHg
AVLVOTPG: 9 mmHg
AVPG: 11 mmHg
AVPKVEL: 163 cm/s
CHL CUP AV PEAK INDEX: 1.22
CHL CUP AV VEL: 2.39
CHL CUP DOP CALC LVOT VTI: 28.5 cm
CHL CUP MV DEC (S): 169
E decel time: 169 msec
E/e' ratio: 9.59
FS: 50 % — AB (ref 28–44)
Height: 61 in
IV/PV OW: 0.98
LA vol A4C: 60 ml
LA vol index: 27.5 mL/m2
LA vol: 63.4 mL
LADIAMINDEX: 1.52 cm/m2
LASIZE: 35 mm
LDCA: 3.14 cm2
LEFT ATRIUM END SYS DIAM: 35 mm
LV SIMPSON'S DISK: 62
LV dias vol index: 29 mL/m2
LV dias vol: 66 mL (ref 46–106)
LV e' LATERAL: 9.46 cm/s
LV sys vol index: 11 mL/m2
LV sys vol: 25 mL (ref 14–42)
LVEEAVG: 9.59
LVEEMED: 9.59
LVOT diameter: 20 mm
LVOT peak VTI: 0.76 cm
LVOT peak vel: 146 cm/s
LVOTSV: 89 mL
Lateral S' vel: 19.1 cm/s
MVPG: 3 mmHg
MVPKAVEL: 104 m/s
MVPKEVEL: 90.7 m/s
PW: 12.1 mm — AB (ref 0.6–1.1)
RV TAPSE: 29.3 mm
Stroke v: 41 ml
TDI e' lateral: 9.46
TDI e' medial: 6.64
Valve area index: 1.04
Weight: 4091.74 oz

## 2016-05-06 LAB — CBC
HEMATOCRIT: 39.8 % (ref 36.0–46.0)
HEMOGLOBIN: 13.6 g/dL (ref 12.0–15.0)
MCH: 30.3 pg (ref 26.0–34.0)
MCHC: 34.2 g/dL (ref 30.0–36.0)
MCV: 88.6 fL (ref 78.0–100.0)
Platelets: 254 10*3/uL (ref 150–400)
RBC: 4.49 MIL/uL (ref 3.87–5.11)
RDW: 12.5 % (ref 11.5–15.5)
WBC: 6.2 10*3/uL (ref 4.0–10.5)

## 2016-05-06 LAB — LIPID PANEL
CHOL/HDL RATIO: 4.6 ratio
CHOLESTEROL: 125 mg/dL (ref 0–200)
HDL: 27 mg/dL — ABNORMAL LOW (ref >40)
LDL Cholesterol: 68 mg/dL (ref 0–99)
TRIGLYCERIDES: 148 mg/dL (ref <150)
VLDL: 30 mg/dL (ref 0–40)

## 2016-05-06 LAB — T3: T3, Total: 176 ng/dL (ref 71–180)

## 2016-05-06 LAB — HEMOGLOBIN A1C
HEMOGLOBIN A1C: 5.6 % (ref 4.8–5.6)
MEAN PLASMA GLUCOSE: 114 mg/dL

## 2016-05-06 MED ORDER — DILTIAZEM HCL ER COATED BEADS 180 MG PO CP24: 180.0000 mg | ORAL_CAPSULE | Freq: Every day | ORAL | Status: DC

## 2016-05-06 MED ORDER — FUROSEMIDE 20 MG PO TABS: 20.0000 mg | ORAL_TABLET | Freq: Every day | ORAL | Status: DC

## 2016-05-06 MED ORDER — PANTOPRAZOLE SODIUM 40 MG PO TBEC: 40.0000 mg | DELAYED_RELEASE_TABLET | Freq: Every day | ORAL | 0 refills | Status: DC

## 2016-05-06 MED ORDER — PERFLUTREN LIPID MICROSPHERE
1.0000 mL | INTRAVENOUS | Status: AC | PRN
  Administered 2016-05-06: 3 mL via INTRAVENOUS
  Filled 2016-05-06: qty 10

## 2016-05-06 MED ORDER — APIXABAN 5 MG PO TABS: 5.0000 mg | ORAL_TABLET | Freq: Two times a day (BID) | ORAL | 0 refills | Status: DC

## 2016-05-06 MED ORDER — DILTIAZEM HCL ER COATED BEADS 180 MG PO CP24: 180.0000 mg | ORAL_CAPSULE | Freq: Every day | ORAL | 0 refills | Status: DC

## 2016-05-06 NOTE — Progress Notes (Signed)
Patient Name: Elaine [name] Date of Encounter: 05/06/2016  Primary Cardiologist: [facility] Problem List     Active Problems:   New onset atrial fibrillation [facility])   Pulmonary edema   Chest pain   Essential hypertension   Atrial fibrillation with rapid ventricular response (HCC)     Subjective  Feeling much better. Converted to NSR around 10:30 pm   Inpatient Medications    Scheduled Meds: . apixaban  5 mg Oral BID  . diltiazem  60 mg Oral Q6H  . docusate sodium  100 mg Oral BID  . furosemide  40 mg Intravenous Daily  . pantoprazole  40 mg Oral Daily  . potassium chloride  20 mEq Oral Daily   Continuous Infusions:   PRN Meds: albuterol, alum & mag hydroxide-simeth, diltiazem   Vital Signs    Vitals:   05/06/16 0400 05/06/16 0444 05/06/16 0647 05/06/16 0708  BP: (!) 94/52  135/86   Pulse: 63   71  Resp: 13   19  Temp: 97.7 F (36.5 C)   97.1 F (36.2 C)  TempSrc: Oral   Oral  SpO2: 98%   99%  Weight:  255 lb 11.7 oz (116 kg)    Height:        Intake/Output Summary (Last 24 hours) at 05/06/16 0731 Last data filed at 05/05/16 2042  Gross per 24 hour  Intake              890 ml  Output             2550 ml  Net            -1660 ml   Filed Weights   05/05/16 0156 05/05/16 0736 05/06/16 0444  Weight: 198 lb (89.8 kg) 260 lb 5.8 oz (118.1 kg) 255 lb 11.7 oz (116 kg)    Physical Exam    GEN: Well nourished, well developed, in no acute distress.  HEENT: Grossly normal.  Neck: Supple, no JVD, carotid bruits, or masses. Cardiac: RRR, no murmurs, rubs, or gallops. No clubbing, cyanosis, edema.  Radials/DP/PT 2+ and equal bilaterally.  Respiratory:  Respirations regular and unlabored, clear to auscultation bilaterally. GI: Soft, nontender, nondistended, BS + x 4. MS: no deformity or atrophy. Skin: warm and dry, no rash. Neuro:  Strength and sensation are intact. Psych: AAOx3.  Normal affect.  Labs    CBC  Recent Labs  05/05/16 0210  05/06/16 0412  WBC 6.2 6.2  NEUTROABS 3.7  --   HGB 15.0 13.6  HCT 42.8 39.8  MCV 88.4 88.6  PLT 298 0000000   Basic Metabolic Panel  Recent Labs  05/05/16 0210 05/05/16 0522 05/06/16 0412  NA 138  --  138  K 3.5  --  3.5  CL 108  --  108  CO2 22  --  25  GLUCOSE 136*  --  136*  BUN 19  --  11  CREATININE 0.81  --  0.68  CALCIUM 8.8*  --  8.3*  MG  --  2.1  --    Liver Function Tests  Recent Labs  05/05/16 0210 05/06/16 0412  AST 19 17  ALT 29 22  ALKPHOS 62 56  BILITOT 0.9 1.1  PROT 7.6 6.6  ALBUMIN 4.2 3.5    Recent Labs  05/05/16 0210  LIPASE 18   Cardiac Enzymes  Recent Labs  05/05/16 0716 05/05/16 1239 05/05/16 1827  TROPONINI 0.04* 0.03* 0.03*    Recent Labs  05/05/16 0716  HGBA1C 5.6   Fasting Lipid Panel  Recent Labs  05/06/16 0412  CHOL 125  HDL 27*  LDLCALC 68  TRIG 148  CHOLHDL 4.6   Thyroid Function Tests  Recent Labs  05/05/16 0508  TSH 6.179*    Telemetry    Converted to NSR around 10:30 pm last night. Heart rate in the 70;s.   ECG    Personally Reviewed Will repeat this am now that she is in NSR  Radiology    Dg Abdomen Acute W/chest  Result Date: 05/05/2016 CLINICAL DATA:  Acute onset of generalized abdominal pain, bloating and nausea. Initial encounter. EXAM: DG ABDOMEN ACUTE W/ 1V CHEST COMPARISON:  None. FINDINGS: The lungs are well-aerated. Vascular congestion is noted. Increased interstitial markings raise concern for pulmonary edema. There is no evidence of pleural effusion or pneumothorax. The cardiomediastinal silhouette is mildly enlarged. The visualized bowel gas pattern is unremarkable. Scattered stool and air are seen within the colon; there is no evidence of small bowel dilatation to suggest obstruction. No free intra-abdominal air is identified on the provided upright view. Clips are noted within the right upper quadrant, reflecting prior cholecystectomy. Clips are also noted at the right hemipelvis.  No acute osseous abnormalities are seen; the sacroiliac joints are unremarkable in appearance. Mild degenerative change is noted at the pubic symphysis. IMPRESSION: 1. Unremarkable bowel gas pattern; no free intra-abdominal air seen. Small to moderate amount of stool noted in the colon. 2. Vascular congestion and mild cardiomegaly. Increased interstitial markings raise concern for pulmonary edema. Electronically Signed   By: Garald Balding M.D.   On: 05/05/2016 04:01    Cardiac Studies   Echo-pending   Patient Profile      50 y.o.female admitted with abdominal pain and bloating, with CXR demonstrating pulmonary edema, and EKG documenting atrial fib with RVR.    Assessment & Plan    1. PAF: Converted to NSR around 10:30 pm last night. She was given one dose of amiodarone 150 mg IV yesterday morning, and two doses of digoxin 0.125 mg X 2 yesterday afternoon. CHADS VASC Score of 2. Continue Eliquis. Echocardiogram will be completed this am now that HR is better controlled. Will continue diltiazem but change to long acting for ease of administration. Uncertain if she needs to be on digoxin as OP. Will discuss with Dr. Bronson Ing. Await echo results to direct medical management.   Consider OSA work up as OP. Will arrange OP follow up with Dr. Bronson Ing   2. Hypertension: Good control of BP this am on diltiazem.   3. Pulmonary edema: Diuresed 2.2 liters with IV lasix. Will change to po daily. Potassium is 3.5 on potassium replacement. May need to be given prn, but for now daily dosing unless echo demonstrates other issues.   4. Abnormal thyroid lab results: Followed by PCP  5. Abdominal Pain: Improved.   Signed, Jory Sims, NP  05/06/2016, 7:31 AM   Pt seen and examined, discussed with hospitalist as well. LVEF 65-70% (vigorous LV systolic function). She can be discharged today. We will arrange follow up in our office. Continue Eliquis for anticoagulation. Agree with sleep apnea  evaluation as outpatient. Treat a fib with long-acting diltiazem. Would only prescribe Lasix prn (not daily).

## 2016-05-06 NOTE — Discharge Summary (Signed)
Elaine [name] [id number] DOB: May 05, 1966 DOA: 05/05/2016  PCP: No PCP Per Patient  Admit date: 05/05/2016  Discharge date: 05/06/2016  Admitted From: Home   Disposition:  Home   Recommendations for Outpatient Follow-up:   Follow up with PCP in 1-2 weeks  PCP Please obtain BMP/CBC, 2 view CXR in 1week,  (see Discharge instructions)   PCP Please follow up on the following pending results: Follow final echo report   Home Health: None   Equipment/Devices: None  Consultations: Cards Discharge Condition: Stable   CODE STATUS: Full   Diet Recommendation: Heart Healthy    Chief Complaint  Patient presents with  . Abdominal Pain     Brief history of present illness from the day of admission and additional interim summary    Elaine [name] a 50 y.o.female,With history of hypertension, status post cholecystectomy in 0000000 complicated by gallstone pancreatitis came to the hospital with abdominal pain, bloating. Also has been complaining of intermittent palpitations with shortness of breath. In the ED x-ray of the chest and abdomen, showed nonobstructive bowel gas pattern and possible pulmonary edema.   BNP was obtained which was 348, EKG showed patient to be in A. fib with RVR. In the ED patient started on IV Cardizem infusion, 1 dose of Lasix 40 mg IV given in the ED.  Patient denies chest pain, only intermittent shortness of breath and orthopnea. No nausea vomiting or diarrhea. No dysuria. No fever   Hospital issues addressed    1. Newly diagnosed atrial fibrillation with RVR likely paroxysmal. Goal will be rate control, rate controlled on Cardizem, [place] vasc 2 score of 2 and placed on Eliquis. Case management to provide one-month supply and after that PCP to arrange for future supply. TSH slightly  elevated and not low, echocardiogram stable. She is currently converted into sinus. Will follow with cardiology outpatient case discussed with cardiologist on-call cleared for discharge.  2. Mild pulmonary edema secondary to RVR, resolved with Lasix.  3. Reflux. Placed on PPI. She says she was scheduled for outpatient EGD 1 year ago but never followed up, requested to follow with GI locally.  4. Hypertension. Continue Cardizem and monitor.  5. Morbid obesity. Follow with PCP for weight loss.  6. Troponins minimally elevated - flat non ACS pattern consistent with demand ischemia from #1. Table TTE.  7. Mild sick Euthyroid - repeat TSH in 4 weeks  By PCP.    TTE  - follow final report, communicated to me by cardiologist on-call Dr Bronson Ing that EF of 65%, no wall motion abnormality and no acute changes.   Discharge diagnosis     Active Problems:   New onset atrial fibrillation [facility])   Pulmonary edema   Chest pain   Essential hypertension   Atrial fibrillation with rapid ventricular response [facility])   Anticoagulation management encounter    Discharge instructions    Discharge Instructions    Diet - low sodium heart healthy    Complete by:  As directed    Discharge instructions  Complete by:  As directed    Follow with Primary MD  in 7 days and get future supply of Eliquis. Follow with recommended GI and cardiology physician also  Get CBC, CMP, TSH, 2 view Chest X ray checked  by Primary MD 7 days ( we routinely change or add medications that can affect your baseline labs and fluid status, therefore we recommend that you get the mentioned basic workup next visit with your PCP, your PCP may decide not to get them or add new tests based on their clinical decision)   Activity: As tolerated with Full fall precautions use walker/cane & assistance as needed   Disposition Home     Diet:   Heart Healthy  .  For Heart failure patients - Check your Weight same time  everyday, if you gain over 2 pounds, or you develop in leg swelling, experience more shortness of breath or chest pain, call your Primary MD immediately. Follow Cardiac Low Salt Diet and 1.5 lit/day fluid restriction.   On your next visit with your primary care physician please Get Medicines reviewed and adjusted.   Please request your Prim.MD to go over all Hospital Tests and Procedure/Radiological results at the follow up, please get all Hospital records sent to your Prim MD by signing hospital release before you go home.   If you experience worsening of your admission symptoms, develop shortness of breath, life threatening emergency, suicidal or homicidal thoughts you must seek medical attention immediately by calling 911 or calling your MD immediately  if symptoms less severe.  You Must read complete instructions/literature along with all the possible adverse reactions/side effects for all the Medicines you take and that have been prescribed to you. Take any new Medicines after you have completely understood and accpet all the possible adverse reactions/side effects.   Do not drive, operate heavy machinery, perform activities at heights, swimming or participation in water activities or provide baby sitting services if your were admitted for syncope or siezures until you have seen by Primary MD or a Neurologist and advised to do so again.  Do not drive when taking Pain medications.    Do not take more than prescribed Pain, Sleep and Anxiety Medications  Special Instructions: If you have smoked or chewed Tobacco  in the last 2 yrs please stop smoking, stop any regular Alcohol  and or any Recreational drug use.  Wear Seat belts while driving.   Please note  You were cared for by a hospitalist during your hospital stay. If you have any questions about your discharge medications or the care you received while you were in the hospital after you are discharged, you can call the unit and asked  to speak with the hospitalist on call if the hospitalist that took care of you is not available. Once you are discharged, your primary care physician will handle any further medical issues. Please note that NO REFILLS for any discharge medications will be authorized once you are discharged, as it is imperative that you return to your primary care physician (or establish a relationship with a primary care physician if you do not have one) for your aftercare needs so that they can reassess your need for medications and monitor your lab values.   Increase activity slowly    Complete by:  As directed       Discharge Medications     Medication List    STOP taking these medications   aspirin EC 81 MG tablet  ibuprofen 200 MG tablet Commonly known as:  ADVIL,MOTRIN   ranitidine 150 MG capsule Commonly known as:  ZANTAC     TAKE these medications   apixaban 5 MG Tabs tablet Commonly known as:  ELIQUIS Take 1 tablet (5 mg total) by mouth 2 (two) times daily. Hospital has provided 30 day free supply future supplies to come from PCP   Biotin 5 MG Tabs Take 5 mg by mouth daily.   diltiazem 180 MG 24 hr capsule Commonly known as:  CARDIZEM CD Take 1 capsule (180 mg total) by mouth daily.   ESTROVEN MENOPAUSE RELIEF PO Take 1 tablet by mouth daily.   multivitamin capsule Take 1 capsule by mouth daily.   pantoprazole 40 MG tablet Commonly known as:  PROTONIX Take 1 tablet (40 mg total) by mouth daily. Can switch to any approved PPI   potassium chloride SA 20 MEQ tablet Commonly known as:  K-DUR,KLOR-CON Take 20 mEq by mouth daily.       Follow-up Information    Jory Sims, NP Follow up on 05/19/2016.   Specialties:  Nurse Practitioner, Radiology, Cardiology Why:  1:30 pm  Contact information: [place] [id number] [phone]        Manus Rudd, MD. Schedule an appointment as soon as possible for a visit in 1 week(s).   Specialty:   Gastroenterology Why:  EGD Contact information: [place] [address] [phone]        [facility] Schedule an appointment as soon as possible for a visit in 1 week(s).   Why:  Get future supply of Eliquis Contact information: [place] [phone]          Major procedures and Radiology Reports - PLEASE review detailed and final reports thoroughly  -         Dg Abdomen Acute W/chest  Result Date: 05/05/2016 CLINICAL DATA:  Acute onset of generalized abdominal pain, bloating and nausea. Initial encounter. EXAM: DG ABDOMEN ACUTE W/ 1V CHEST COMPARISON:  None. FINDINGS: The lungs are well-aerated. Vascular congestion is noted. Increased interstitial markings raise concern for pulmonary edema. There is no evidence of pleural effusion or pneumothorax. The cardiomediastinal silhouette is mildly enlarged. The visualized bowel gas pattern is unremarkable. Scattered stool and air are seen within the colon; there is no evidence of small bowel dilatation to suggest obstruction. No free intra-abdominal air is identified on the provided upright view. Clips are noted within the right upper quadrant, reflecting prior cholecystectomy. Clips are also noted at the right hemipelvis. No acute osseous abnormalities are seen; the sacroiliac joints are unremarkable in appearance. Mild degenerative change is noted at the pubic symphysis. IMPRESSION: 1. Unremarkable bowel gas pattern; no free intra-abdominal air seen. Small to moderate amount of stool noted in the colon. 2. Vascular congestion and mild cardiomegaly. Increased interstitial markings raise concern for pulmonary edema. Electronically Signed   By: Garald Balding M.D.   On: 05/05/2016 04:01    Micro Results     Recent Results (from the past 240 hour(s))  MRSA PCR Screening     Status: Abnormal   Collection Time: 05/05/16  6:46 AM  Result Value Ref Range Status    MRSA by PCR POSITIVE (A) NEGATIVE Final    Comment:        The GeneXpert MRSA Assay (FDA approved for NASAL specimens only), is one component of a comprehensive MRSA colonization surveillance program. It  is not intended to diagnose MRSA infection nor to guide or monitor treatment for MRSA infections. RESULT CALLED TO, READ BACK BY AND VERIFIED WITH: HEARN,J ON 05/05/16 AT 2235 BY LOY,C     Today   Subjective    Girlie Auen today has no headache,no chest abdominal pain,no new weakness tingling or numbness, feels much better wants to go home today.     Objective   Blood pressure 135/86, pulse 71, temperature 97.1 F (36.2 C), temperature source Oral, resp. rate 19, height 5\' 1"  (1.549 m), weight 116 kg (255 lb 11.7 oz), last menstrual period 02/14/2012, SpO2 99 %.   Intake/Output Summary (Last 24 hours) at 05/06/16 0908 Last data filed at 05/05/16 2042  Gross per 24 hour  Intake              890 ml  Output             2000 ml  Net            -1110 ml    Exam Awake Alert, Oriented x 3, No new F.N deficits, Normal affect [place].AT,PERRAL Supple Neck,No JVD, No cervical lymphadenopathy appriciated.  Symmetrical Chest wall movement, Good air movement bilaterally, CTAB RRR,No Gallops,Rubs or new Murmurs, No Parasternal Heave +ve B.Sounds, Abd Soft, Non tender, No organomegaly appriciated, No rebound -guarding or rigidity. No Cyanosis, Clubbing or edema, No new Rash or bruise   Data Review   CBC w Diff: Lab Results  Component Value Date   WBC 6.2 05/06/2016   HGB 13.6 05/06/2016   HCT 39.8 05/06/2016   PLT 254 05/06/2016   LYMPHOPCT 33 05/05/2016   MONOPCT 5 05/05/2016   EOSPCT 3 05/05/2016   BASOPCT 0 05/05/2016    CMP: Lab Results  Component Value Date   NA 138 05/06/2016   K 3.5 05/06/2016   CL 108 05/06/2016   CO2 25 05/06/2016   BUN 11 05/06/2016   CREATININE 0.68 05/06/2016   PROT 6.6 05/06/2016   ALBUMIN 3.5 05/06/2016   BILITOT 1.1 05/06/2016    ALKPHOS 56 05/06/2016   AST 17 05/06/2016   ALT 22 05/06/2016  .   Total Time in preparing paper work, data evaluation and todays exam - 35 minutes  Thurnell Lose M.D on 05/06/2016 at 9:08 AM  [facility]   Office  [phone]

## 2016-05-06 NOTE — Progress Notes (Signed)
*  PRELIMINARY RESULTS* Echocardiogram 2D Echocardiogram has been performed with Definity.  Samuel Germany 05/06/2016, 8:58 AM

## 2016-05-06 NOTE — Progress Notes (Signed)
[facility] called and message left to please try to complete patient's ordered echo today first as requested by Dr.Singh.

## 2016-05-06 NOTE — Progress Notes (Signed)
0940 d/c instructions and paperwork given to patient. IV catheter removed from LEFT hand, catheter intact, no s/s of infection/infiltration noted, patient tolerated well. Patient confirmed she has all of her belongings she brought with her to the facility. Daughter at bedside to transport patient home.

## 2016-05-06 NOTE — Progress Notes (Signed)
0745 telemetry and VS monitoring d/c as ordered per Dr.Singh. Telemetry wiring removed from patient and patient made aware.

## 2016-05-09 ENCOUNTER — Emergency Department (HOSPITAL_COMMUNITY)
Admission: EM | Admit: 2016-05-09 | Discharge: 2016-05-09 | Disposition: A | Discharge status: Home / Self Care | Payer: Self-pay | Attending: Emergency Medicine | Admitting: Emergency Medicine

## 2016-05-09 ENCOUNTER — Encounter (HOSPITAL_COMMUNITY): Payer: Self-pay | Admitting: Emergency Medicine

## 2016-05-09 ENCOUNTER — Emergency Department (HOSPITAL_COMMUNITY): Payer: Self-pay

## 2016-05-09 DIAGNOSIS — Z79899 Other long term (current) drug therapy: Secondary | ICD-10-CM | POA: Insufficient documentation

## 2016-05-09 DIAGNOSIS — R5383 Other fatigue: Secondary | ICD-10-CM | POA: Insufficient documentation

## 2016-05-09 DIAGNOSIS — R110 Nausea: Secondary | ICD-10-CM | POA: Insufficient documentation

## 2016-05-09 DIAGNOSIS — R002 Palpitations: Secondary | ICD-10-CM | POA: Insufficient documentation

## 2016-05-09 DIAGNOSIS — R0602 Shortness of breath: Secondary | ICD-10-CM | POA: Insufficient documentation

## 2016-05-09 DIAGNOSIS — I10 Essential (primary) hypertension: Secondary | ICD-10-CM | POA: Insufficient documentation

## 2016-05-09 LAB — CBC WITH DIFFERENTIAL/PLATELET
BASOS ABS: 0 10*3/uL (ref 0.0–0.1)
BASOS PCT: 0 %
Eosinophils Absolute: 0.3 10*3/uL (ref 0.0–0.7)
Eosinophils Relative: 4 %
HEMATOCRIT: 41.5 % (ref 36.0–46.0)
HEMOGLOBIN: 14.5 g/dL (ref 12.0–15.0)
Lymphocytes Relative: 25 %
Lymphs Abs: 1.6 10*3/uL (ref 0.7–4.0)
MCH: 30.4 pg (ref 26.0–34.0)
MCHC: 34.9 g/dL (ref 30.0–36.0)
MCV: 87 fL (ref 78.0–100.0)
MONOS PCT: 5 %
Monocytes Absolute: 0.3 10*3/uL (ref 0.1–1.0)
NEUTROS ABS: 4.1 10*3/uL (ref 1.7–7.7)
NEUTROS PCT: 66 %
Platelets: 289 10*3/uL (ref 150–400)
RBC: 4.77 MIL/uL (ref 3.87–5.11)
RDW: 12.5 % (ref 11.5–15.5)
WBC: 6.3 10*3/uL (ref 4.0–10.5)

## 2016-05-09 LAB — COMPREHENSIVE METABOLIC PANEL
ALBUMIN: 4.3 g/dL (ref 3.5–5.0)
ALK PHOS: 68 U/L (ref 38–126)
ALT: 29 U/L (ref 14–54)
AST: 26 U/L (ref 15–41)
Anion gap: 7 (ref 5–15)
BILIRUBIN TOTAL: 1 mg/dL (ref 0.3–1.2)
BUN: 17 mg/dL (ref 6–20)
CALCIUM: 9 mg/dL (ref 8.9–10.3)
CO2: 23 mmol/L (ref 22–32)
CREATININE: 0.85 mg/dL (ref 0.44–1.00)
Chloride: 108 mmol/L (ref 101–111)
GFR calc Af Amer: >60 mL/min (ref >60)
GFR calc non Af Amer: >60 mL/min (ref >60)
GLUCOSE: 119 mg/dL — AB (ref 65–99)
Potassium: 3.6 mmol/L (ref 3.5–5.1)
SODIUM: 138 mmol/L (ref 135–145)
TOTAL PROTEIN: 7.7 g/dL (ref 6.5–8.1)

## 2016-05-09 LAB — I-STAT TROPONIN, ED: Troponin i, poc: 0 ng/mL (ref 0.00–0.08)

## 2016-05-09 LAB — PROTIME-INR
INR: 1.05
PROTHROMBIN TIME: 13.7 s (ref 11.4–15.2)

## 2016-05-09 LAB — LIPASE, BLOOD: Lipase: 21 U/L (ref 11–51)

## 2016-05-09 LAB — LACTIC ACID, PLASMA: Lactic Acid, Venous: 0.8 mmol/L (ref 0.5–1.9)

## 2016-05-09 LAB — MAGNESIUM: MAGNESIUM: 2.2 mg/dL (ref 1.7–2.4)

## 2016-05-09 LAB — D-DIMER, QUANTITATIVE (NOT AT ARMC): D DIMER QUANT: 0.39 ug{FEU}/mL (ref 0.00–0.50)

## 2016-05-09 LAB — TSH: TSH: 2.763 u[IU]/mL (ref 0.350–4.500)

## 2016-05-09 LAB — BRAIN NATRIURETIC PEPTIDE: B NATRIURETIC PEPTIDE 5: 31 pg/mL (ref 0.0–100.0)

## 2016-05-09 NOTE — ED Triage Notes (Signed)
Patient states she was treated here recently for abdominal pain and given fentanyl. States after she was given the fentanyl she started having palpitations and irregular heartbeat. States she was admitted for irregular heartbeat and given cardizem. States she did not take cardizem after discharge due to shortness of breath. States her blood pressure has been up and down. No complaints of pain or shortness of breath today. Patient questions "I'm not sure if I should be taking the cardizem or not and I'm wondering if the irregular heartbeat was a reaction to the fentanyl."

## 2016-05-09 NOTE — ED Provider Notes (Signed)
[facility] DEPT Provider Note   CSN: [id number] Arrival date & time: 05/09/16  1636     History   Chief Complaint Chief Complaint  Patient presents with  . Hypertension    HPI Elaine [name] is a 50 y.o. female With a past medical history significant for hypertension and recent admission for atrial fibrillation with RVR who presents with resolved shortness of breath/palpitations and persistent fatigue. Patient reports that she has some chronic abdominal pains and was being evaluated in the ED several days ago when she was discovered to be in atrial fibrillation with RVR. Patient is concerned that fentanyl put her this rhythm. She says that after admission, her rhythm changed back into sinus and she was controlled with diltiazem. Patient is on Eliquis for Anticoagulation. She reports that she was discharged two days ago and yesterday, had episode of shortness of breath. She said that she was also lightheaded during that episode. It did not last long and subsequently resolved. She denies a chest pain. She reports that her fatigue and lightheadedness worsened after taking her diltiazem yesterday. She is concerned the Dilt dropped her heart rate and blood pressure too low.  She reports that She checked her blood pressure this morning and it was 125/78 and her heart rate was in the 60s when she was due to take her diltiazem. She decided to hold it today because she is concerned it would make her symptomatic and too tired. She presents to the ED for evaluation.  She denies any chest pain today, and says her fatigue is chronic. She continues to have some abdominal aching which is unchanged. She denies any neurologic complaints.    The history is provided by the patient and medical records. No language interpreter was used.  Dizziness  Quality:  Lightheadedness Severity:  Mild Onset quality:  Gradual Duration:  2 days Timing:  Sporadic Progression:  Waxing and waning Chronicity:   New Context: medication   Context: not with loss of consciousness   Relieved by:  Nothing Worsened by:  Nothing Ineffective treatments:  None tried Associated symptoms: nausea, palpitations (resolved) and shortness of breath   Associated symptoms: no chest pain, no diarrhea, no headaches, no syncope, no vomiting and no weakness   Risk factors: new medications (diltiazem and eliquis)   Risk factors: no hx of stroke     Past Medical History:  Diagnosis Date  . Anemia   . Endometriosis   . Fibroids   . GERD (gastroesophageal reflux disease)   . Hypertension   . Pancreatitis 09/2010    Patient Active Problem List   Diagnosis Date Noted  . Anticoagulation management encounter   . New onset atrial fibrillation ([place]) 05/05/2016  . Pulmonary edema 05/05/2016  . Atrial fibrillation with rapid ventricular response ([place]) 05/05/2016  . Abdominal bloating   . Dyspnea   . Chest pain   . Essential hypertension   . Menorrhagia 12/02/2010  . Anemia 12/02/2010    Past Surgical History:  Procedure Laterality Date  . ABDOMINAL HYSTERECTOMY    . LAPAROSCOPIC CHOLECYSTECTOMY  09/2010   [facility]  . LAPAROSCOPIC TUBAL LIGATION  1995  . SUPRACERVICAL ABDOMINAL HYSTERECTOMY  02/15/2012   Procedure: HYSTERECTOMY SUPRACERVICAL ABDOMINAL;  Surgeon: Florian Buff, MD;  Location: [facility];  Service: Gynecology;  Laterality: N/A;  . TONSILLECTOMY     child  . TONSILLECTOMY    . TUBAL LIGATION      OB History    Gravida Para Term Preterm AB Living  6 6 6  0 0 6   SAB TAB Ectopic Multiple Live Births   0 0 0 0         Home Medications    Prior to Admission medications   Medication Sig Start Date End Date Taking? Authorizing Provider  apixaban (ELIQUIS) 5 MG TABS tablet Take 1 tablet (5 mg total) by mouth 2 (two) times daily. Hospital has provided 30 day free supply future supplies to come from PCP 05/06/16  Yes Thurnell Lose, MD  Biotin 5 MG TABS Take 5 mg by mouth daily.   Yes  Historical Provider, MD  Black Cohosh-SoyIsoflav-Magnol (ESTROVEN MENOPAUSE RELIEF PO) Take 1 tablet by mouth daily.   Yes Historical Provider, MD  diltiazem (CARDIZEM CD) 180 MG 24 hr capsule Take 1 capsule (180 mg total) by mouth daily. 05/06/16  Yes Thurnell Lose, MD  Multiple Vitamin (MULTIVITAMIN) capsule Take 1 capsule by mouth daily.   Yes Historical Provider, MD  pantoprazole (PROTONIX) 40 MG tablet Take 1 tablet (40 mg total) by mouth daily. Can switch to any approved PPI 05/06/16  Yes Thurnell Lose, MD  potassium chloride SA (K-DUR,KLOR-CON) 20 MEQ tablet Take 20 mEq by mouth daily.   Yes Historical Provider, MD    Family History Family History  Problem Relation Age of Onset  . Non-Hodgkin's lymphoma Mother   . Heart attack Father   . Thyroid disease Sister     Social History Social History  Substance Use Topics  . Smoking status: Never Smoker  . Smokeless tobacco: Never Used  . Alcohol use Yes     Comment: "socially"     Allergies   Hydrocodone   Review of Systems Review of Systems  Constitutional: Positive for fatigue. Negative for activity change, chills, diaphoresis and fever.  HENT: Negative for congestion and rhinorrhea.   Eyes: Negative for visual disturbance.  Respiratory: Positive for shortness of breath. Negative for cough, chest tightness, wheezing and stridor.   Cardiovascular: Positive for palpitations (resolved). Negative for chest pain and syncope.  Gastrointestinal: Positive for nausea. Negative for abdominal distention, diarrhea and vomiting.  Genitourinary: Negative for dysuria and frequency.  Musculoskeletal: Negative for back pain, neck pain and neck stiffness.  Skin: Negative for wound.  Neurological: Positive for light-headedness. Negative for dizziness, syncope, facial asymmetry, weakness, numbness and headaches.  Psychiatric/Behavioral: Negative for agitation and confusion.  All other systems reviewed and are negative.    Physical  Exam Updated Vital Signs BP 174/99 (BP Location: Left Arm)   Pulse 86   Temp 98 F (36.7 C) (Oral)   Resp 18   Ht 5\' 1"  (1.549 m)   Wt 262 lb (118.8 kg)   LMP 02/14/2012   SpO2 100%   BMI 49.50 kg/m   Physical Exam  Constitutional: She is oriented to person, place, and time. She appears well-developed and well-nourished. No distress.  HENT:  Head: Normocephalic and atraumatic.  Mouth/Throat: Oropharynx is clear and moist. No oropharyngeal exudate.  Eyes: Conjunctivae and EOM are normal. Pupils are equal, round, and reactive to light.  Neck: Normal range of motion. Neck supple.  Cardiovascular: Normal rate, regular rhythm, normal heart sounds and intact distal pulses.   No murmur heard. Pulmonary/Chest: Effort normal and breath sounds normal. No respiratory distress. She exhibits no tenderness.  Abdominal: Soft. There is no tenderness.  Musculoskeletal: She exhibits no edema, tenderness or deformity.  Neurological: She is alert and oriented to person, place, and time. She has normal reflexes. She displays  normal reflexes. No cranial nerve deficit. She exhibits normal muscle tone. Coordination normal.  Skin: Skin is warm and dry. Capillary refill takes less than 2 seconds. She is not diaphoretic.  Psychiatric: She has a normal mood and affect.  Nursing note and vitals reviewed.    ED Treatments / Results  Labs (all labs ordered are listed, but only abnormal results are displayed) Labs Reviewed  COMPREHENSIVE METABOLIC PANEL - Abnormal; Notable for the following:       Result Value   Glucose, Bld 119 (*)    All other components within normal limits  CBC WITH DIFFERENTIAL/PLATELET  LACTIC ACID, PLASMA  LIPASE, BLOOD  TSH  BRAIN NATRIURETIC PEPTIDE  MAGNESIUM  PROTIME-INR  D-DIMER, QUANTITATIVE (NOT AT [facility])  T4, FREE  I-STAT TROPOININ, ED    EKG  EKG Interpretation  Date/Time:  Monday May 09 2016 17:05:15 EDT Ventricular Rate:  87 PR Interval:    QRS  Duration: 93 QT Interval:  406 QTC Calculation: 489 R Axis:   -85 Text Interpretation:  Sinus rhythm Left anterior fascicular block Low voltage, precordial leads Consider anterior infarct Baseline wander in lead(s) II III aVF NO STEMI Confirmed by Sherry Ruffing MD, Ida Uppal ([id number]) on 05/10/2016 11:04:47 AM       Radiology Dg Chest 2 View  Result Date: 05/09/2016 CLINICAL DATA:  Shortness of breath.  Possible drug reaction. EXAM: CHEST  2 VIEW COMPARISON:  05/05/2016 and 10/03/2014. FINDINGS: The heart remains enlarged. The mediastinal contours are stable. The pulmonary edema noted on the prior examination has resolved. There is no confluent airspace opacity, pleural effusion or pneumothorax. Mild thoracic spine degenerative changes are present. IMPRESSION: Interval resolution of pulmonary edema.  Stable cardiomegaly. Electronically Signed   By: Richardean Sale M.D.   On: 05/09/2016 19:17    Procedures Procedures (including critical care time)  Medications Ordered in ED Medications - No data to display   Initial Impression / Assessment and Plan / ED Course  I have reviewed the triage vital signs and the nursing notes.  Pertinent labs & imaging results that were available during my care of the patient were reviewed by me and considered in my medical decision making (see chart for details).  Clinical Course    Elaine [name] is a 50 y.o. female With a past medical history significant for hypertension and recent admission for atrial fibrillation with RVR who presents with resolved shortness of breath/palpitations and persistent fatigue.  History and exam seen above.  Patient primarily here for evaluation of fatigue and medication question. Patient was not hypertensive during evaluation and her blood pressure was in the 130s For the majority of her evaluation.  Due to recent admission, patient had laboratory imaging testing to look for occult infection that may have caused her symptoms.  Chart review shows that patient had mild pulmonary edema during her admission.  EKG showed patient in sinus rhythm. Patients lab testing all returned unremarkable with resolution of BNP elevation. TSH normal, troponin normal, D Dimer normal, and other lab testing also unremarkable.   Chest x-ray showed resolution of pulmonary edema.  Patient reassured that laboratory and imaging workup all appears to be improving from prior. Feel patients lightheadedness may have been secondary to medication effect of diltiazem yesterday. Patient was instructed to follow up with her PCP for further medication changes and hole on the diltiazem unless she is experiencing the palpitations And tachycardia. Patient was given return precautions for any new or worsening symptoms. Patient had no other questions  or concerns patient was discharged in good condition.    Final Clinical Impressions(s) / ED Diagnoses   Final diagnoses:  Fatigue, unspecified type  Palpitations    New Prescriptions New Prescriptions   No medications on file    Clinical Impression: 1. Fatigue, unspecified type   2. Palpitations     Disposition: Discharge  Condition: Good  I have discussed the results, Dx and Tx plan with the pt(& family if present). He/she/they expressed understanding and agree(s) with the plan. Discharge instructions discussed at great length. Strict return precautions discussed and pt &/or family have verbalized understanding of the instructions. No further questions at time of discharge.    Discharge Medication List as of 05/09/2016  9:13 PM      Follow Up: [facility] [address] [place] [phone] Schedule an appointment as soon as possible for a visit    [facility] [address] [id number] mc [place] [phone]  If symptoms worsen     Courtney Paris, MD 05/10/16  1114

## 2016-05-10 LAB — T4, FREE: Free T4: 1.31 ng/dL — ABNORMAL HIGH (ref 0.61–1.12)

## 2016-05-19 ENCOUNTER — Encounter: Payer: Self-pay | Admitting: Physician Assistant

## 2016-05-19 ENCOUNTER — Ambulatory Visit (INDEPENDENT_AMBULATORY_CARE_PROVIDER_SITE_OTHER): Payer: Self-pay | Admitting: Physician Assistant

## 2016-05-19 VITALS — BP 140/90 | HR 96 | Ht 61.0 in | Wt 248.0 lb

## 2016-05-19 DIAGNOSIS — J810 Acute pulmonary edema: Secondary | ICD-10-CM

## 2016-05-19 DIAGNOSIS — I4891 Unspecified atrial fibrillation: Secondary | ICD-10-CM

## 2016-05-19 DIAGNOSIS — I10 Essential (primary) hypertension: Secondary | ICD-10-CM

## 2016-05-19 MED ORDER — APIXABAN 5 MG PO TABS: 5.0000 mg | ORAL_TABLET | Freq: Two times a day (BID) | ORAL | 11 refills | Status: DC

## 2016-05-19 MED ORDER — METOPROLOL SUCCINATE ER 25 MG PO TB24: 25.0000 mg | ORAL_TABLET | Freq: Every day | ORAL | 11 refills | Status: DC

## 2016-05-19 NOTE — Patient Instructions (Signed)
Your physician recommends that you schedule a follow-up appointment in: 2 Months with Dr. Bronson Ing.   Your physician has recommended you make the following change in your medication:  Start Eliquis 5 mg Two Times Daily  Start Toprol XL 25 mg Daily   Your physician has recommended that you wear a holter monitor. Holter monitors are medical devices that record the heart's electrical activity. Doctors most often use these monitors to diagnose arrhythmias. Arrhythmias are problems with the speed or rhythm of the heartbeat. The monitor is a small, portable device. You can wear one while you do your normal daily activities. This is usually used to diagnose what is causing palpitations/syncope (passing out).   If you need a refill on your cardiac medications before your next appointment, please call your pharmacy.  Thank you for choosing [facility]!

## 2016-05-19 NOTE — Progress Notes (Signed)
Cardiology Office Note    Date:  05/19/2016   ID:  Elaine [name], DOB 11/30/1965, MRN [id number]  PCP:  No PCP Per Patient  Cardiologist: Bronson Ing  Chief Complaint  Patient presents with  . Follow-up    History of Present Illness:  Elaine [name] is a 50 y.o. female who was admitted to the hospital with abdominal pain and bloating and went into atrial fib with RVR and pulmonary edema. She diuresed 2.2 L with IV Lasix and 2-D echo showed LVEF 65-70% with mild LVH and grade 1 DD. She converted to normal sinus rhythm with IV amiodarone.CHADSVASC=2 so started on Eliquis and Diltiazem. She has history of hypertension, Chronic abdominal bloating and pain, abnormal thyroid labs and sleep apnea test was recommended.  Patient went to the emergency room 05/09/16 because she was concerned about taking her medications with a blood pressure of 125/78 and a heart rate in the 60s. She was having fatigue and lightheadedness and was told to take diltiazem only for palpitations and tachycardia.  Patient comes in today for follow-up. She says she has severe reaction to the diltiazem with numbness and tingling in her arms and dizziness. She stopped it as well as the Eliquis. She denies any further palpitations or shortness of breath. She is extremely anxious that this will happen again. She was on metoprolol prior to hospitalization but they stopped this when he started diltiazem.    Past Medical History:  Diagnosis Date  . Anemia   . Endometriosis   . Fibroids   . GERD (gastroesophageal reflux disease)   . Hypertension   . Pancreatitis 09/2010    Past Surgical History:  Procedure Laterality Date  . ABDOMINAL HYSTERECTOMY    . LAPAROSCOPIC CHOLECYSTECTOMY  09/2010   [facility]  . LAPAROSCOPIC TUBAL LIGATION  1995  . SUPRACERVICAL ABDOMINAL HYSTERECTOMY  02/15/2012   Procedure: HYSTERECTOMY SUPRACERVICAL ABDOMINAL;  Surgeon: Florian Buff, MD;  Location: [facility];  Service: Gynecology;  Laterality:  N/A;  . TONSILLECTOMY     child  . TONSILLECTOMY    . TUBAL LIGATION      Current Medications: Outpatient Medications Prior to Visit  Medication Sig Dispense Refill  . Biotin 5 MG TABS Take 5 mg by mouth daily.    . Black Cohosh-SoyIsoflav-Magnol (ESTROVEN MENOPAUSE RELIEF PO) Take 1 tablet by mouth daily.    . Multiple Vitamin (MULTIVITAMIN) capsule Take 1 capsule by mouth daily.    . pantoprazole (PROTONIX) 40 MG tablet Take 1 tablet (40 mg total) by mouth daily. Can switch to any approved PPI 30 tablet 0  . potassium chloride SA (K-DUR,KLOR-CON) 20 MEQ tablet Take 20 mEq by mouth daily.    Marland Kitchen apixaban (ELIQUIS) 5 MG TABS tablet Take 1 tablet (5 mg total) by mouth 2 (two) times daily. Hospital has provided 30 day free supply future supplies to come from PCP 60 tablet 0  . diltiazem (CARDIZEM CD) 180 MG 24 hr capsule Take 1 capsule (180 mg total) by mouth daily. 30 capsule 0   No facility-administered medications prior to visit.      Allergies:   Hydrocodone   Social History   Social History  . Marital status: Legally Separated    Spouse name: N/A  . Number of children: 6  . Years of education: 44   Occupational History  . CNA     [employer]   Social History Main Topics  . Smoking status: Never Smoker  . Smokeless tobacco: Never  Used  . Alcohol use Yes     Comment: "socially"  . Drug use: No  . Sexual activity: Not Asked   Other Topics Concern  . None   Social History Narrative  . None     Family History:  The patient's   family history includes Heart attack in her father; Non-Hodgkin's lymphoma in her mother; Thyroid disease in her sister.   ROS:   Please see the history of present illness.    Review of Systems  Constitution: Negative.  HENT: Negative.   Eyes: Negative.   Cardiovascular: Negative.   Respiratory: Negative.   Hematologic/Lymphatic: Negative.   Musculoskeletal: Negative.  Negative for joint pain.  Gastrointestinal: Positive for  bloating.  Genitourinary: Negative.   Neurological: Negative.    All other systems reviewed and are negative.   PHYSICAL EXAM:   VS:  BP 140/90   Pulse 96   Ht 5\' 1"  (1.549 m)   Wt 248 lb (112.5 kg)   LMP 02/14/2012   SpO2 98%   BMI 46.86 kg/m   Physical Exam  GEN: Obese, in no acute distress  Neck: no JVD, carotid bruits, or masses Cardiac:RRR; no murmurs, rubs, or gallops  Respiratory:  clear to auscultation bilaterally, normal work of breathing GI: soft, nontender, nondistended, + BS Ext: without cyanosis, clubbing, or edema, Good distal pulses bilaterally MS: no deformity or atrophy  Skin: warm and dry, no rash Psych: euthymic mood, full affect  Wt Readings from Last 3 Encounters:  05/19/16 248 lb (112.5 kg)  05/09/16 262 lb (118.8 kg)  05/06/16 255 lb 11.7 oz (116 kg)      Studies/Labs Reviewed:   EKG:  EKG is ordered today.  The ekg ordered today demonstrates Normal sinus rhythm with poor R-wave progression, no acute change  Recent Labs: 05/09/2016: ALT 29; B Natriuretic Peptide 31.0; BUN 17; Creatinine, Ser 0.85; Hemoglobin 14.5; Magnesium 2.2; Platelets 289; Potassium 3.6; Sodium 138; TSH 2.763   Lipid Panel    Component Value Date/Time   CHOL 125 05/06/2016 0412   TRIG 148 05/06/2016 0412   HDL 27 (L) 05/06/2016 0412   CHOLHDL 4.6 05/06/2016 0412   VLDL 30 05/06/2016 0412   LDLCALC 68 05/06/2016 0412    Additional studies/ records that were reviewed today include:  Study Conclusions   - Procedure narrative: Transthoracic echocardiography. Image   quality was suboptimal. The study was technically difficult, as a   result of poor acoustic windows, poor sound wave transmission,   and body habitus. Intravenous contrast (Definity) was   administered. - Left ventricle: The cavity size was normal. Wall thickness was   increased in a pattern of mild LVH. Systolic function was   vigorous. The estimated ejection fraction was in the range of 65%   to 70%.  Wall motion was normal; there were no regional wall   motion abnormalities. Doppler parameters are consistent with   abnormal left ventricular relaxation (grade 1 diastolic   dysfunction).     ASSESSMENT:    1. Atrial fibrillation with rapid ventricular response ([place])   2. Acute pulmonary edema ([facility])   3. Essential hypertension      PLAN:  In order of problems listed above:  Atrial fibrillation with RVR and associated pulmonary edema in the setting of abdominal pain and bloating which she has frequently. CHADSVASC=2.She did not tolerate diltiazem and stopped it as well as the Eliquis. She has had no further symptoms. Will restart metoprolol XL 25 mg once daily and  Eliquis 5 mg bid. She will wear a 30 day monitor to rule out any further arrhythmias. Follow-up with Dr.Koneswaran in 2 months and can discuss need for long term Eliquis.  Acute pulmonary edema in the setting of rapid atrial fibrillation resolved quickly with diuresis. Does not take regular diuretics. Normal LVEF with diastolic dysfunction. Recommend better control of blood pressure. Hopefully metoprolol will help with this.  Hypertension blood pressure is elevated today. Patient is not taking any medications for this at this time. Will restart metoprolol.   Medication Adjustments/Labs and Tests Ordered: Current medicines are reviewed at length with the patient today.  Concerns regarding medicines are outlined above.  Medication changes, Labs and Tests ordered today are listed in the Patient Instructions below. There are no Patient Instructions on file for this visit.   Sumner Boast, PA-C  05/19/2016 2:24 PM    [facility] [place], [place], [place]  [address] Phone: [phone]; Fax: [phone]

## 2016-05-23 ENCOUNTER — Telehealth: Payer: Self-pay | Admitting: Physician Assistant

## 2016-05-23 MED ORDER — METOPROLOL TARTRATE 25 MG PO TABS: 25.0000 mg | ORAL_TABLET | Freq: Two times a day (BID) | ORAL | 1 refills | Status: DC

## 2016-05-23 NOTE — Telephone Encounter (Signed)
Yes she can switch back to metoprolol

## 2016-05-23 NOTE — Telephone Encounter (Signed)
Pt confirmed she took lopressor 25 mg BID,e-scribed to [facility]

## 2016-05-23 NOTE — Telephone Encounter (Signed)
Patient states that she saw ML last week and was switched to Metoprolol XR. STates that she can not take that and would like it changed back to Metoprolol. / tg

## 2016-05-23 NOTE — Telephone Encounter (Signed)
Pt has muscle weakness and muscle tightness,wants to go back to lopressor 25 mg BID and get off Toprol.She said her body is very sensitive to medications

## 2016-07-05 ENCOUNTER — Telehealth: Payer: Self-pay

## 2016-07-05 NOTE — Telephone Encounter (Signed)
Pt was enrolled 05/19/16 for event monitor  Received cancellation notice from [employer] that pt cancelled monitor on 07/04/16  Unable to reach pt

## 2016-08-02 ENCOUNTER — Ambulatory Visit: Payer: 59 | Admitting: Cardiovascular Disease

## 2016-08-02 ENCOUNTER — Encounter: Payer: Self-pay | Admitting: Cardiovascular Disease

## 2017-03-29 ENCOUNTER — Emergency Department
Admission: EM | Admit: 2017-03-29 | Discharge: 2017-03-29 | Disposition: A | Discharge status: Home / Self Care | Payer: No Typology Code available for payment source | Attending: Emergency Medicine | Admitting: Emergency Medicine

## 2017-03-29 ENCOUNTER — Encounter: Payer: Self-pay | Admitting: Emergency Medicine

## 2017-03-29 DIAGNOSIS — Z79899 Other long term (current) drug therapy: Secondary | ICD-10-CM | POA: Diagnosis not present

## 2017-03-29 DIAGNOSIS — Y999 Unspecified external cause status: Secondary | ICD-10-CM | POA: Insufficient documentation

## 2017-03-29 DIAGNOSIS — S43401A Unspecified sprain of right shoulder joint, initial encounter: Secondary | ICD-10-CM | POA: Diagnosis not present

## 2017-03-29 DIAGNOSIS — Z7901 Long term (current) use of anticoagulants: Secondary | ICD-10-CM | POA: Insufficient documentation

## 2017-03-29 DIAGNOSIS — Y939 Activity, unspecified: Secondary | ICD-10-CM | POA: Diagnosis not present

## 2017-03-29 DIAGNOSIS — Y92410 Unspecified street and highway as the place of occurrence of the external cause: Secondary | ICD-10-CM | POA: Insufficient documentation

## 2017-03-29 DIAGNOSIS — S199XXA Unspecified injury of neck, initial encounter: Secondary | ICD-10-CM | POA: Diagnosis present

## 2017-03-29 DIAGNOSIS — S161XXA Strain of muscle, fascia and tendon at neck level, initial encounter: Secondary | ICD-10-CM

## 2017-03-29 DIAGNOSIS — M25511 Pain in right shoulder: Secondary | ICD-10-CM

## 2017-03-29 DIAGNOSIS — I10 Essential (primary) hypertension: Secondary | ICD-10-CM | POA: Insufficient documentation

## 2017-03-29 DIAGNOSIS — M25512 Pain in left shoulder: Secondary | ICD-10-CM | POA: Insufficient documentation

## 2017-03-29 HISTORY — DX: Tachycardia, unspecified: R00.0

## 2017-03-29 HISTORY — DX: Unspecified atrial fibrillation: I48.91

## 2017-03-29 MED ORDER — OXYCODONE-ACETAMINOPHEN 5-325 MG PO TABS
1.0000 | ORAL_TABLET | Freq: Once | ORAL | Status: AC
  Administered 2017-03-29: 1 via ORAL
  Filled 2017-03-29: qty 1

## 2017-03-29 MED ORDER — TRAMADOL HCL 50 MG PO TABS: 50.0000 mg | ORAL_TABLET | Freq: Four times a day (QID) | ORAL | 0 refills | Status: DC | PRN

## 2017-03-29 MED ORDER — OXYCODONE-ACETAMINOPHEN 5-325 MG PO TABS: 1.0000 | ORAL_TABLET | Freq: Four times a day (QID) | ORAL | 0 refills | Status: AC | PRN

## 2017-03-29 MED ORDER — CYCLOBENZAPRINE HCL 10 MG PO TABS: 10.0000 mg | ORAL_TABLET | Freq: Three times a day (TID) | ORAL | 0 refills | Status: DC | PRN

## 2017-03-29 MED ORDER — ONDANSETRON 8 MG PO TBDP
8.0000 mg | ORAL_TABLET | Freq: Once | ORAL | Status: AC
  Administered 2017-03-29: 8 mg via ORAL
  Filled 2017-03-29: qty 1

## 2017-03-29 NOTE — Discharge Instructions (Signed)
Take medication as prescribed. Return to emergency department if symptoms worsen and follow-up with PCP as needed.    In addition to prescribed medication, you may use cold or heat therapy for symptom relief.
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
## 2017-04-03 ENCOUNTER — Emergency Department (HOSPITAL_COMMUNITY): Payer: No Typology Code available for payment source

## 2017-04-03 ENCOUNTER — Emergency Department (HOSPITAL_COMMUNITY)
Admission: EM | Admit: 2017-04-03 | Discharge: 2017-04-03 | Disposition: A | Discharge status: Home / Self Care | Payer: No Typology Code available for payment source | Attending: Emergency Medicine | Admitting: Emergency Medicine

## 2017-04-03 ENCOUNTER — Encounter (HOSPITAL_COMMUNITY): Payer: Self-pay | Admitting: Emergency Medicine

## 2017-04-03 ENCOUNTER — Other Ambulatory Visit: Payer: Self-pay

## 2017-04-03 DIAGNOSIS — S20211A Contusion of right front wall of thorax, initial encounter: Secondary | ICD-10-CM | POA: Diagnosis not present

## 2017-04-03 DIAGNOSIS — Y999 Unspecified external cause status: Secondary | ICD-10-CM | POA: Insufficient documentation

## 2017-04-03 DIAGNOSIS — Y939 Activity, unspecified: Secondary | ICD-10-CM | POA: Diagnosis not present

## 2017-04-03 DIAGNOSIS — I10 Essential (primary) hypertension: Secondary | ICD-10-CM | POA: Insufficient documentation

## 2017-04-03 DIAGNOSIS — Y92410 Unspecified street and highway as the place of occurrence of the external cause: Secondary | ICD-10-CM | POA: Diagnosis not present

## 2017-04-03 DIAGNOSIS — Z79899 Other long term (current) drug therapy: Secondary | ICD-10-CM | POA: Insufficient documentation

## 2017-04-03 DIAGNOSIS — Z7901 Long term (current) use of anticoagulants: Secondary | ICD-10-CM | POA: Insufficient documentation

## 2017-04-03 DIAGNOSIS — S29001A Unspecified injury of muscle and tendon of front wall of thorax, initial encounter: Secondary | ICD-10-CM | POA: Diagnosis present

## 2017-04-03 MED ORDER — TRAMADOL HCL 50 MG PO TABS: ORAL_TABLET | ORAL | 0 refills | Status: DC

## 2017-04-03 MED ORDER — DEXAMETHASONE 4 MG PO TABS: 4.0000 mg | ORAL_TABLET | Freq: Two times a day (BID) | ORAL | 0 refills | Status: DC

## 2017-04-03 MED ORDER — CYCLOBENZAPRINE HCL 10 MG PO TABS: 10.0000 mg | ORAL_TABLET | Freq: Three times a day (TID) | ORAL | 0 refills | Status: DC
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
## 2017-04-17 ENCOUNTER — Telehealth: Payer: Self-pay | Admitting: *Deleted

## 2017-04-17 NOTE — Telephone Encounter (Signed)
[employer] mailed patient assistance forms for Eliquis. Pt needs to come by office and sign pt assistance from and bring proof of income.

## 2017-05-19 ENCOUNTER — Encounter: Payer: Self-pay | Admitting: Cardiovascular Disease

## 2017-05-19 ENCOUNTER — Ambulatory Visit (INDEPENDENT_AMBULATORY_CARE_PROVIDER_SITE_OTHER): Payer: Self-pay | Admitting: Cardiovascular Disease

## 2017-05-19 VITALS — BP 126/84 | HR 72 | Ht 61.0 in | Wt 248.0 lb

## 2017-05-19 DIAGNOSIS — I712 Thoracic aortic aneurysm, without rupture, unspecified: Secondary | ICD-10-CM

## 2017-05-19 DIAGNOSIS — I10 Essential (primary) hypertension: Secondary | ICD-10-CM

## 2017-05-19 DIAGNOSIS — I480 Paroxysmal atrial fibrillation: Secondary | ICD-10-CM

## 2017-05-19 DIAGNOSIS — Z23 Encounter for immunization: Secondary | ICD-10-CM

## 2017-05-19 MED ORDER — METOPROLOL TARTRATE 25 MG PO TABS: 25.0000 mg | ORAL_TABLET | Freq: Two times a day (BID) | ORAL | 3 refills | Status: DC

## 2017-05-19 MED ORDER — FUROSEMIDE 20 MG PO TABS: 20.0000 mg | ORAL_TABLET | Freq: Every day | ORAL | 3 refills | Status: DC | PRN

## 2017-05-19 NOTE — Patient Instructions (Signed)
Your physician wants you to follow-up in: 1 year with Dr.Koneswaran You will receive a reminder letter in the mail two months in advance. If you don't receive a letter, please call our office to schedule the follow-up appointment.   Start Lasix 20 mg daily as needed   I refilled your Metoprolol     If you need a refill on your cardiac medications before your next appointment, please call your pharmacy.      No lab tests or lab work ordered today.      Thank you for choosing [facility] !
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
## 2017-05-22 ENCOUNTER — Encounter (HOSPITAL_COMMUNITY): Payer: Self-pay | Admitting: Emergency Medicine

## 2017-05-22 ENCOUNTER — Emergency Department (HOSPITAL_COMMUNITY)
Admission: EM | Admit: 2017-05-22 | Discharge: 2017-05-22 | Disposition: A | Discharge status: Home / Self Care | Payer: Self-pay | Attending: Emergency Medicine | Admitting: Emergency Medicine

## 2017-05-22 ENCOUNTER — Emergency Department (HOSPITAL_COMMUNITY): Payer: Self-pay

## 2017-05-22 DIAGNOSIS — R1010 Upper abdominal pain, unspecified: Secondary | ICD-10-CM

## 2017-05-22 DIAGNOSIS — I10 Essential (primary) hypertension: Secondary | ICD-10-CM | POA: Insufficient documentation

## 2017-05-22 DIAGNOSIS — R1011 Right upper quadrant pain: Secondary | ICD-10-CM | POA: Insufficient documentation

## 2017-05-22 DIAGNOSIS — Z7901 Long term (current) use of anticoagulants: Secondary | ICD-10-CM | POA: Insufficient documentation

## 2017-05-22 DIAGNOSIS — Z79899 Other long term (current) drug therapy: Secondary | ICD-10-CM | POA: Insufficient documentation

## 2017-05-22 LAB — COMPREHENSIVE METABOLIC PANEL
ALBUMIN: 4.1 g/dL (ref 3.5–5.0)
ALT: 34 U/L (ref 14–54)
ANION GAP: 7 (ref 5–15)
AST: 24 U/L (ref 15–41)
Alkaline Phosphatase: 65 U/L (ref 38–126)
BUN: 12 mg/dL (ref 6–20)
CO2: 23 mmol/L (ref 22–32)
Calcium: 8.9 mg/dL (ref 8.9–10.3)
Chloride: 109 mmol/L (ref 101–111)
Creatinine, Ser: 0.78 mg/dL (ref 0.44–1.00)
GFR calc non Af Amer: >60 mL/min (ref >60)
Glucose, Bld: 104 mg/dL — ABNORMAL HIGH (ref 65–99)
POTASSIUM: 3.8 mmol/L (ref 3.5–5.1)
SODIUM: 139 mmol/L (ref 135–145)
Total Bilirubin: 1 mg/dL (ref 0.3–1.2)
Total Protein: 7.8 g/dL (ref 6.5–8.1)

## 2017-05-22 LAB — URINALYSIS, ROUTINE W REFLEX MICROSCOPIC
BILIRUBIN URINE: NEGATIVE
Glucose, UA: NEGATIVE mg/dL
Hgb urine dipstick: NEGATIVE
Ketones, ur: NEGATIVE mg/dL
Leukocytes, UA: NEGATIVE
NITRITE: NEGATIVE
PH: 5 (ref 5.0–8.0)
Protein, ur: NEGATIVE mg/dL
SPECIFIC GRAVITY, URINE: 1.018 (ref 1.005–1.030)

## 2017-05-22 LAB — CBC
HEMATOCRIT: 42.8 % (ref 36.0–46.0)
HEMOGLOBIN: 14.8 g/dL (ref 12.0–15.0)
MCH: 30.8 pg (ref 26.0–34.0)
MCHC: 34.6 g/dL (ref 30.0–36.0)
MCV: 89 fL (ref 78.0–100.0)
Platelets: 251 10*3/uL (ref 150–400)
RBC: 4.81 MIL/uL (ref 3.87–5.11)
RDW: 12.7 % (ref 11.5–15.5)
WBC: 5.3 10*3/uL (ref 4.0–10.5)

## 2017-05-22 LAB — LIPASE, BLOOD: Lipase: 24 U/L (ref 11–51)

## 2017-05-22 MED ORDER — PANTOPRAZOLE SODIUM 20 MG PO TBEC: 20.0000 mg | DELAYED_RELEASE_TABLET | Freq: Every day | ORAL | 1 refills | Status: DC

## 2017-05-22 NOTE — ED Triage Notes (Signed)
PT c/o upper abdominal discomfort with increased belching and pain that radiates to her back over the past week. PT also states continued pain from inflamed area to right upper chest from [facility] 03/2017.

## 2017-05-22 NOTE — Discharge Instructions (Signed)
Follow-up with your family doctor or dr. Gala Romney in 2-3 weeks

## 2017-05-22 NOTE — ED Provider Notes (Signed)
[facility] EMERGENCY DEPARTMENT Provider Note   CSN: [id number] Arrival date & time: 05/22/17  1324     History   Chief Complaint Chief Complaint  Patient presents with  . Abdominal Pain    HPI KAYTEE TALIERCIO is a 51 y.o. female.  Patient complains of epigastric abdominal pain and gas   The history is provided by the patient.  Abdominal Pain   This is a recurrent problem. The current episode started more than 2 days ago. The problem occurs constantly. The problem has not changed since onset.The pain is associated with an unknown factor. The pain is located in the epigastric region. The quality of the pain is aching. The pain is at a severity of 6/10. The pain is moderate. Associated symptoms include flatus. Pertinent negatives include diarrhea, frequency, hematuria and headaches.    Past Medical History:  Diagnosis Date  . Anemia   . Atrial fibrillation ([place])   . Endometriosis   . Fibroids   . GERD (gastroesophageal reflux disease)   . Hypertension   . Pancreatitis 09/2010  . Tachycardia     Patient Active Problem List   Diagnosis Date Noted  . Anticoagulation management encounter   . New onset atrial fibrillation ([place]) 05/05/2016  . Pulmonary edema 05/05/2016  . Atrial fibrillation with rapid ventricular response ([place]) 05/05/2016  . Abdominal bloating   . Dyspnea   . Chest pain   . Essential hypertension   . Menorrhagia 12/02/2010  . Anemia 12/02/2010    Past Surgical History:  Procedure Laterality Date  . ABDOMINAL HYSTERECTOMY    . LAPAROSCOPIC CHOLECYSTECTOMY  09/2010   [facility]  . LAPAROSCOPIC TUBAL LIGATION  1995  . TONSILLECTOMY     child  . TONSILLECTOMY    . TUBAL LIGATION      OB History    Gravida Para Term Preterm AB Living   6 6 6  0 0 6   SAB TAB Ectopic Multiple Live Births   0 0 0 0         Home Medications    Prior to Admission medications   Medication Sig Start Date End Date Taking? Authorizing Provider  apixaban  (ELIQUIS) 5 MG TABS tablet Take 1 tablet (5 mg total) by mouth 2 (two) times daily. Patient taking differently: Take 2.5 mg 2 (two) times daily by mouth.  05/19/16  Yes Imogene Burn, PA-C  EVENING PRIMROSE OIL PO Take 1 capsule 2 (two) times daily by mouth.   Yes [provider]  GOLDEN SEAL PO Take 1 tablet daily by mouth.   Yes [provider]  metoprolol tartrate (LOPRESSOR) 25 MG tablet Take 1 tablet (25 mg total) by mouth 2 (two) times daily. 05/19/17  Yes Herminio Commons, MD  Multiple Vitamins-Minerals (ECHINACEA ACZ PO) Take 1 capsule daily by mouth.   Yes [provider]  Omega-3 Fatty Acids (FISH OIL) 1000 MG CAPS Take 1 capsule daily by mouth.   Yes [provider]  potassium chloride SA (K-DUR,KLOR-CON) 20 MEQ tablet Take 20 mEq by mouth daily.   Yes [provider]  Probiotic Product (PROBIOTIC-10 PO) Take 1 tablet 2 (two) times daily by mouth.   Yes [provider]  vitamin E 100 UNIT capsule Take 100 Units 2 (two) times daily by mouth.   Yes [provider]  furosemide (LASIX) 20 MG tablet Take 1 tablet (20 mg total) by mouth daily as needed. 05/19/17 08/17/17  Herminio Commons, MD  pantoprazole ([place])  20 MG tablet Take 1 tablet (20 mg total) daily by mouth. 05/22/17   Milton Ferguson, MD    Family History Family History  Problem Relation Age of Onset  . Non-Hodgkin's lymphoma Mother   . Heart attack Father   . Thyroid disease Sister   . Diabetes Maternal Grandmother   . CAD Maternal Grandmother     Social History Social History   Tobacco Use  . Smoking status: Never Smoker  . Smokeless tobacco: Never Used  Substance Use Topics  . Alcohol use: Yes    Comment: "socially"  . Drug use: No     Allergies   Hydrocodone; Vicodin [hydrocodone-acetaminophen]; and Fentanyl   Review of Systems Review of Systems  Constitutional: Negative for appetite change and fatigue.  HENT: Negative for  congestion, ear discharge and sinus pressure.   Eyes: Negative for discharge.  Respiratory: Negative for cough.   Cardiovascular: Negative for chest pain.  Gastrointestinal: Positive for abdominal pain and flatus. Negative for diarrhea.  Genitourinary: Negative for frequency and hematuria.  Musculoskeletal: Negative for back pain.  Skin: Negative for rash.  Neurological: Negative for seizures and headaches.  Psychiatric/Behavioral: Negative for hallucinations.     Physical Exam Updated Vital Signs BP (!) 163/87 (BP Location: Left Arm)   Pulse 70   Temp 98.2 F (36.8 C) (Oral)   Resp 18   Ht 5\' 1"  (1.549 m)   Wt 112.5 kg (248 lb)   LMP 02/14/2012   SpO2 99%   BMI 46.86 kg/m   Physical Exam  Constitutional: She is oriented to person, place, and time. She appears well-developed.  HENT:  Head: Normocephalic.  Eyes: Conjunctivae and EOM are normal. No scleral icterus.  Neck: Neck supple. No thyromegaly present.  Cardiovascular: Normal rate and regular rhythm. Exam reveals no gallop and no friction rub.  No murmur heard. Pulmonary/Chest: No stridor. She has no wheezes. She has no rales. She exhibits no tenderness.  Abdominal: She exhibits no distension. There is tenderness. There is no rebound.  Musculoskeletal: Normal range of motion. She exhibits no edema.  Lymphadenopathy:    She has no cervical adenopathy.  Neurological: She is oriented to person, place, and time. She exhibits normal muscle tone. Coordination normal.  Skin: No rash noted. No erythema.  Psychiatric: She has a normal mood and affect. Her behavior is normal.     ED Treatments / Results  Labs (all labs ordered are listed, but only abnormal results are displayed) Labs Reviewed  COMPREHENSIVE METABOLIC PANEL - Abnormal; Notable for the following components:      Result Value   Glucose, Bld 104 (*)    All other components within normal limits  URINALYSIS, ROUTINE W REFLEX MICROSCOPIC - Abnormal; Notable  for the following components:   APPearance HAZY (*)    All other components within normal limits  LIPASE, BLOOD  CBC    EKG  EKG Interpretation None       Radiology Dg Abd Acute W/chest  Result Date: 05/22/2017 CLINICAL DATA:  Chest pain and abdominal pain EXAM: DG ABDOMEN ACUTE W/ 1V CHEST COMPARISON:  None. FINDINGS: There is no evidence of dilated bowel loops or free intraperitoneal air. No radiopaque calculi or other significant radiographic abnormality is seen. Heart size and mediastinal contours are within normal limits. Both lungs are clear. IMPRESSION: Negative abdominal radiographs.  No acute cardiopulmonary disease. Electronically Signed   By: Ulyses Jarred M.D.   On: 05/22/2017 18:21    Procedures Procedures (including critical care  time)  Medications Ordered in ED Medications - No data to display   Initial Impression / Assessment and Plan / ED Course  I have reviewed the triage vital signs and the nursing notes.  Pertinent labs & imaging results that were available during my care of the patient were reviewed by me and considered in my medical decision making (see chart for details).     Labs and x-ray unremarkable.  Suspect peptic ulcer problems.  Patient will be placed on Protonix and will follow up with her doctor or a GI MD  Final Clinical Impressions(s) / ED Diagnoses   Final diagnoses:  Pain of upper abdomen    ED Discharge Orders        Ordered    pantoprazole (PROTONIX) 20 MG tablet  Daily     05/22/17 2041       Milton Ferguson, MD 05/22/17 2045

## 2017-05-25 ENCOUNTER — Telehealth: Payer: Self-pay | Admitting: Cardiovascular Disease

## 2017-05-25 NOTE — Telephone Encounter (Signed)
April w/ [facility] pt assistance called stating the pt is needing a refill for her apixaban (ELIQUIS) 5 MG TABS tablet [[id number]]   Telephone # [phone] ext [phone] Fax # [phone]

## 2017-05-25 NOTE — Telephone Encounter (Signed)
Refill given for Eliquis

## 2017-06-05 ENCOUNTER — Telehealth: Payer: Self-pay | Admitting: Gastroenterology

## 2017-06-05 NOTE — Telephone Encounter (Signed)
Pt was seen in the ER recently and was told to follow up with [place]. Can we accept her as a new patient?

## 2017-06-05 NOTE — Telephone Encounter (Signed)
Ok to schedule ov.  

## 2017-06-06 NOTE — Telephone Encounter (Signed)
Pt is aware of OV tomorrow at 130 with LSL

## 2017-06-07 ENCOUNTER — Encounter: Payer: Self-pay | Admitting: *Deleted

## 2017-06-07 ENCOUNTER — Encounter: Payer: Self-pay | Admitting: Gastroenterology

## 2017-06-07 ENCOUNTER — Ambulatory Visit (INDEPENDENT_AMBULATORY_CARE_PROVIDER_SITE_OTHER): Payer: Self-pay | Admitting: Gastroenterology

## 2017-06-07 VITALS — BP 166/93 | HR 66 | Temp 97.1°F | Ht 61.0 in | Wt 241.2 lb

## 2017-06-07 DIAGNOSIS — R1013 Epigastric pain: Secondary | ICD-10-CM

## 2017-06-07 DIAGNOSIS — K219 Gastro-esophageal reflux disease without esophagitis: Secondary | ICD-10-CM | POA: Insufficient documentation

## 2017-06-07 DIAGNOSIS — R1310 Dysphagia, unspecified: Secondary | ICD-10-CM

## 2017-06-07 DIAGNOSIS — R1319 Other dysphagia: Secondary | ICD-10-CM

## 2017-06-07 MED ORDER — ESOMEPRAZOLE MAGNESIUM 40 MG PO CPDR: 40.0000 mg | DELAYED_RELEASE_CAPSULE | Freq: Every day | ORAL | 0 refills | Status: DC

## 2017-06-07 NOTE — Patient Instructions (Signed)
1. Stop pantoprazole.  Start Nexium 40mg ,  30 minutes before breakfast daily. Samples provided.  2. Xray of esophagus and stomach in two weeks as discussed.  3. Return office visit in 2 months, can consider colonoscopy at that time for ROUTINE screening.
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
## 2017-06-11 ENCOUNTER — Encounter: Payer: Self-pay | Admitting: Gastroenterology

## 2017-06-11 NOTE — Assessment & Plan Note (Signed)
51 y/o female with several weeks of epigastric discomfort, bloating, belching, dysphagia in setting of significant anxiety. Has been on pantoprazole 20mg  daily for few weeks without relief. No prior EGD. Previously declined when seen in [place] by GI. Notes improvement in symptoms with use of Xanax prior to meals. Patient has significant anxiety exacerbated by multiple recent stressful/emotional events. She is opposed to endoscopy at this time. She is agreeable to BPE/UGI to evaluate dysphagia and UGI symptoms. Will switch her to Nexium 40mg  daily. Samples for 30 days provided. Consider TCS in couple of months for routine screening IF she is agreeable. Return OV 2 months.

## 2017-06-12 NOTE — Progress Notes (Signed)
cc'ed to pcp °

## 2017-06-16 ENCOUNTER — Telehealth: Payer: Self-pay

## 2017-06-16 NOTE — Telephone Encounter (Signed)
Patient called today with right bicep pain and elevated systolic readings 984/73, 151/103, 150/100  She was running with 2 lbs weights in her hands, I suggested she stop until bicep feels better.   I also re-requested her CT form [facility], she was wondering about that.

## 2017-06-20 ENCOUNTER — Telehealth: Payer: Self-pay | Admitting: *Deleted

## 2017-06-20 NOTE — Telephone Encounter (Signed)
CT Chest (req'd prev from [facility]) - results did show a very mild, very small aneurysm.  Will repeat CT in 1 year.  Recall placed in Epic.   Patient notified and verbalized understanding.

## 2017-06-21 ENCOUNTER — Ambulatory Visit (HOSPITAL_COMMUNITY): Admission: RE | Admit: 2017-06-21 | Payer: Self-pay | Source: Ambulatory Visit

## 2017-06-21 ENCOUNTER — Ambulatory Visit (HOSPITAL_COMMUNITY)
Admission: RE | Admit: 2017-06-21 | Discharge: 2017-06-21 | Disposition: A | Discharge status: Home / Self Care | Payer: Self-pay | Source: Ambulatory Visit | Attending: Gastroenterology | Admitting: Gastroenterology

## 2017-06-21 DIAGNOSIS — R1013 Epigastric pain: Secondary | ICD-10-CM | POA: Insufficient documentation

## 2017-06-21 DIAGNOSIS — K219 Gastro-esophageal reflux disease without esophagitis: Secondary | ICD-10-CM | POA: Insufficient documentation

## 2017-06-21 DIAGNOSIS — K449 Diaphragmatic hernia without obstruction or gangrene: Secondary | ICD-10-CM | POA: Insufficient documentation

## 2017-06-21 DIAGNOSIS — R1319 Other dysphagia: Secondary | ICD-10-CM

## 2017-06-21 DIAGNOSIS — R1310 Dysphagia, unspecified: Secondary | ICD-10-CM | POA: Insufficient documentation

## 2017-06-26 NOTE — Progress Notes (Signed)
Please let patient know her ugi series showed small sliding hiatal hernia but no other findings.  How is she feeling on nexium? Keep ov as planned in 2 months.

## 2017-07-05 ENCOUNTER — Telehealth: Payer: Self-pay | Admitting: Cardiovascular Disease

## 2017-07-05 ENCOUNTER — Encounter: Payer: Self-pay | Admitting: *Deleted

## 2017-07-05 NOTE — Telephone Encounter (Signed)
Pt called and states that she was recently in [facility] and placed on Xanax 0.5 mg. Pt reports that she takes 1/2 tab and the morning and 1 tab at bedtime. Pt reports that this has helped her BP a lot. Pt states that she was told by 2 Doctors while at [facility] to contact her "heart doctor" for refills on this medication. Informed patient to call her PCP for refills. Pt states that she is seen at [facility]. And will need a doctor to write the order before they will refill it. Hospital records have been requested and received and are available for review.

## 2017-07-05 NOTE — Telephone Encounter (Signed)
Please give pt a call concerning ER visit to [facility] and medication changes that were made during that visit.

## 2017-07-06 NOTE — Telephone Encounter (Signed)
Pt notified and she voiced understanding.

## 2017-07-06 NOTE — Telephone Encounter (Signed)
I will review records. That being said, unfortunately, I will not be able to provide such a prescription.

## 2017-08-22 NOTE — Progress Notes (Signed)
Please let patient know that I am sorry that I overlooked the last response by mistake. She was supposed to have an OV last month as well but I'm not sure why she was never scheduled. At this time would advise f/u to reassess her symptoms. May use urgent if needed to get her in within the next 1-2 weeks.

## 2017-08-23 NOTE — Progress Notes (Signed)
PATIENT SCHEDULED AND CALLED WITH DATE AND TIME

## 2017-09-07 ENCOUNTER — Telehealth: Payer: Self-pay | Admitting: Gastroenterology

## 2017-09-07 ENCOUNTER — Ambulatory Visit: Payer: Self-pay | Admitting: Gastroenterology

## 2017-09-07 ENCOUNTER — Encounter: Payer: Self-pay | Admitting: Gastroenterology

## 2017-09-07 NOTE — Telephone Encounter (Signed)
PATIENT WAS A NO SHOW AND LETTER SENT  °

## 2017-11-28 IMAGING — DX DG ABDOMEN ACUTE W/ 1V CHEST
4 series · 4 of 4 positions shown · non-contrast
Comparison: None.

CLINICAL DATA: Acute onset of generalized abdominal pain, bloating
and nausea. Initial encounter.

EXAM:
DG ABDOMEN ACUTE W/ 1V CHEST

[chest pa]
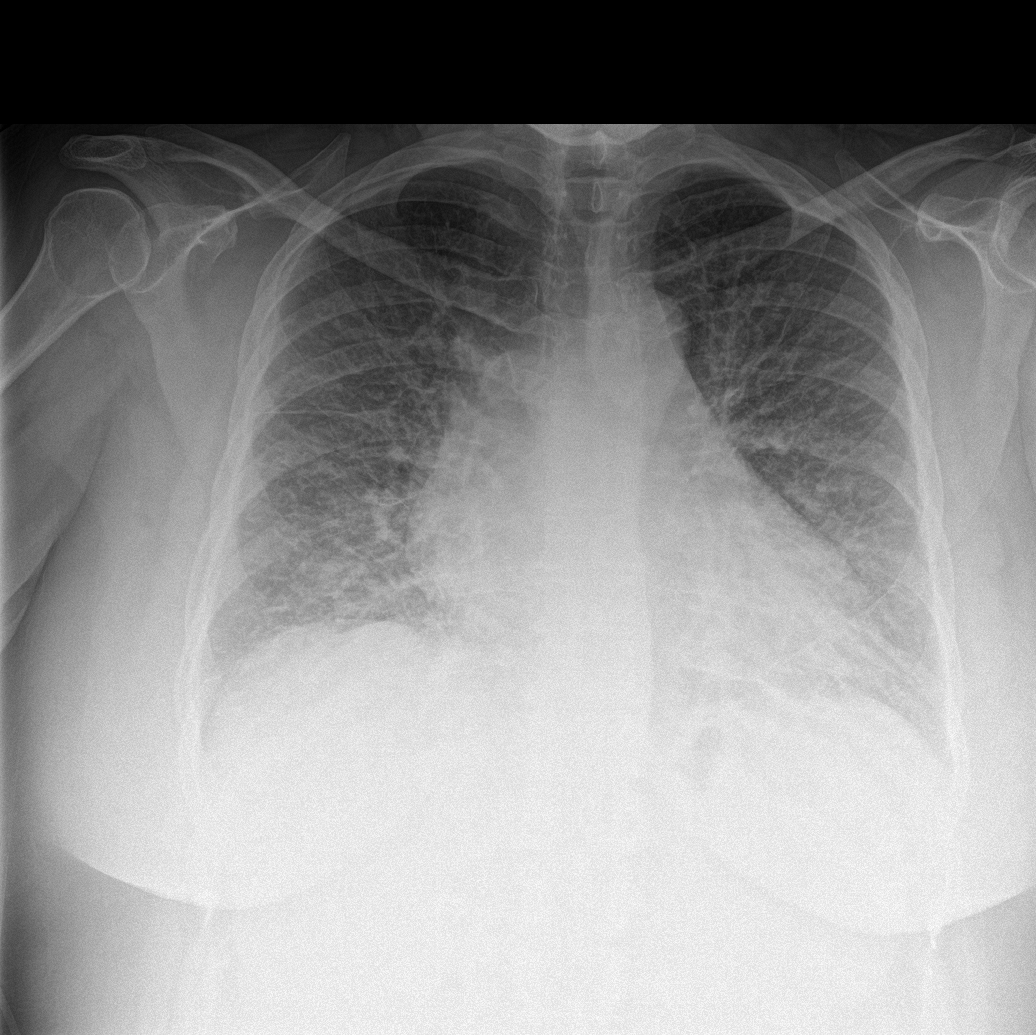

[abdomen erect]
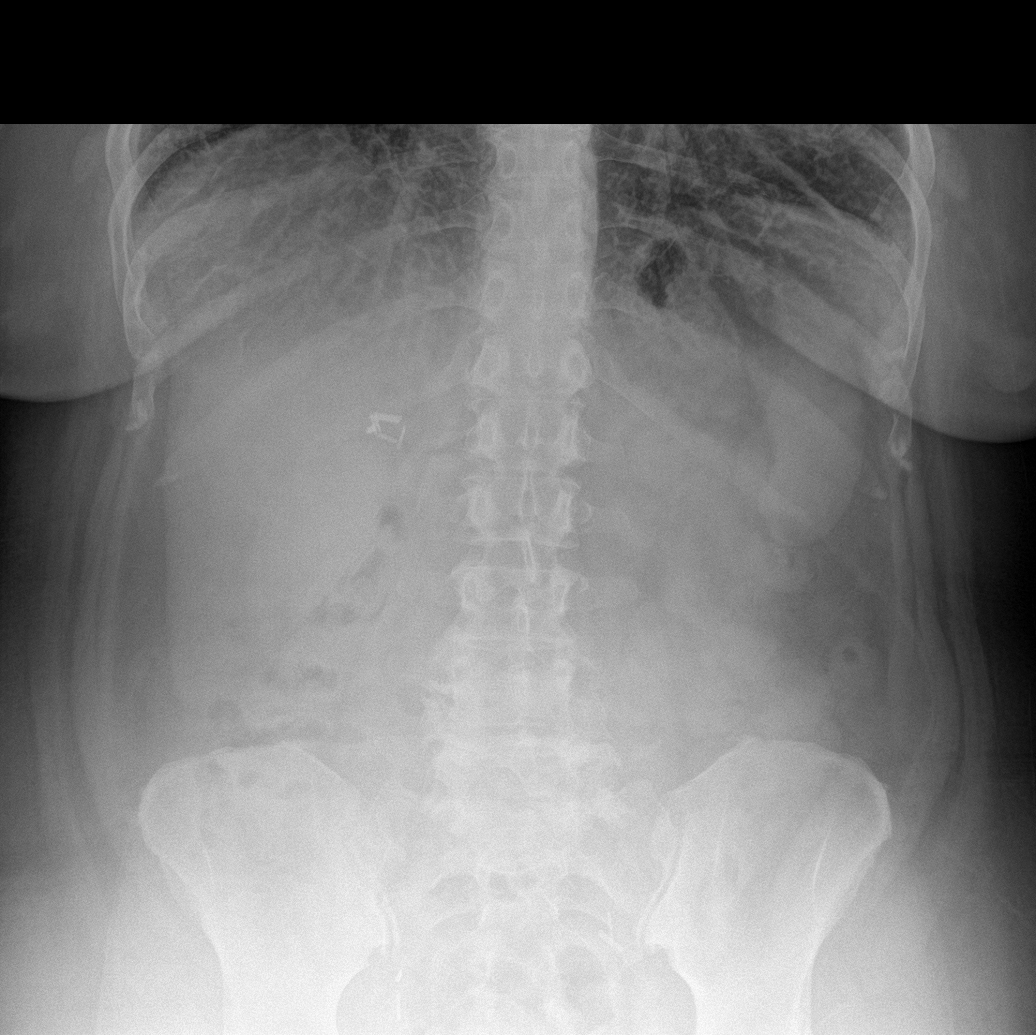

[abdomen supine (1 of 2)]
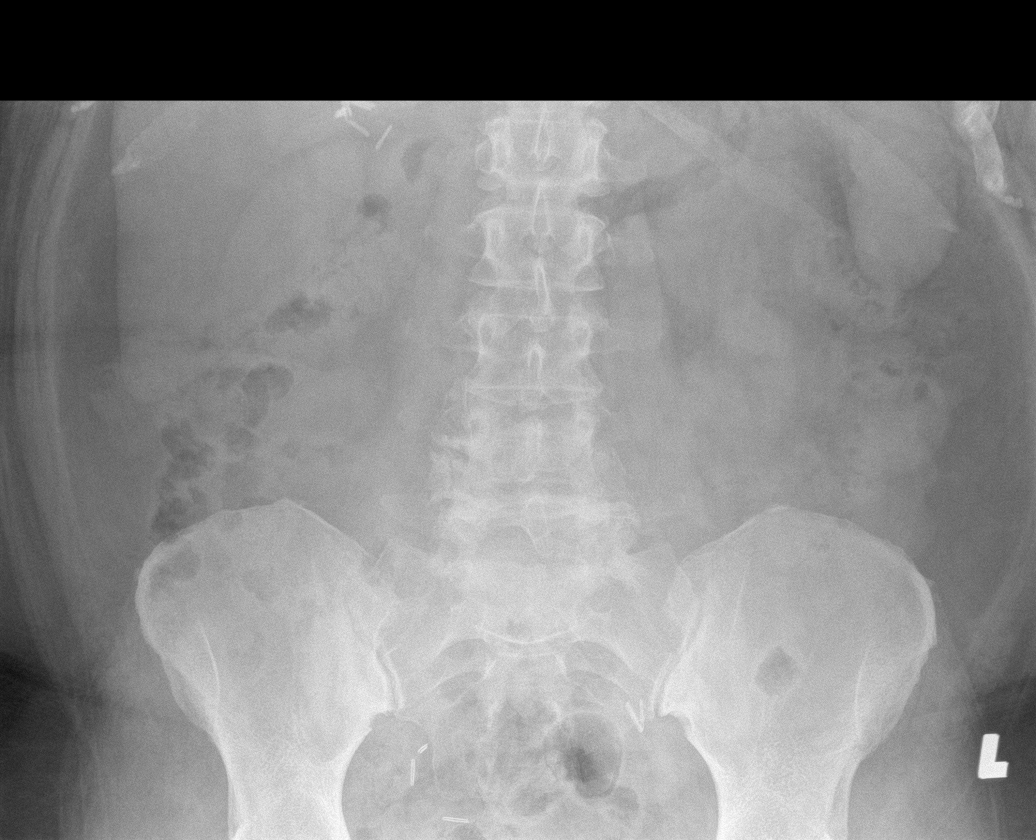

[abdomen supine (2 of 2)]
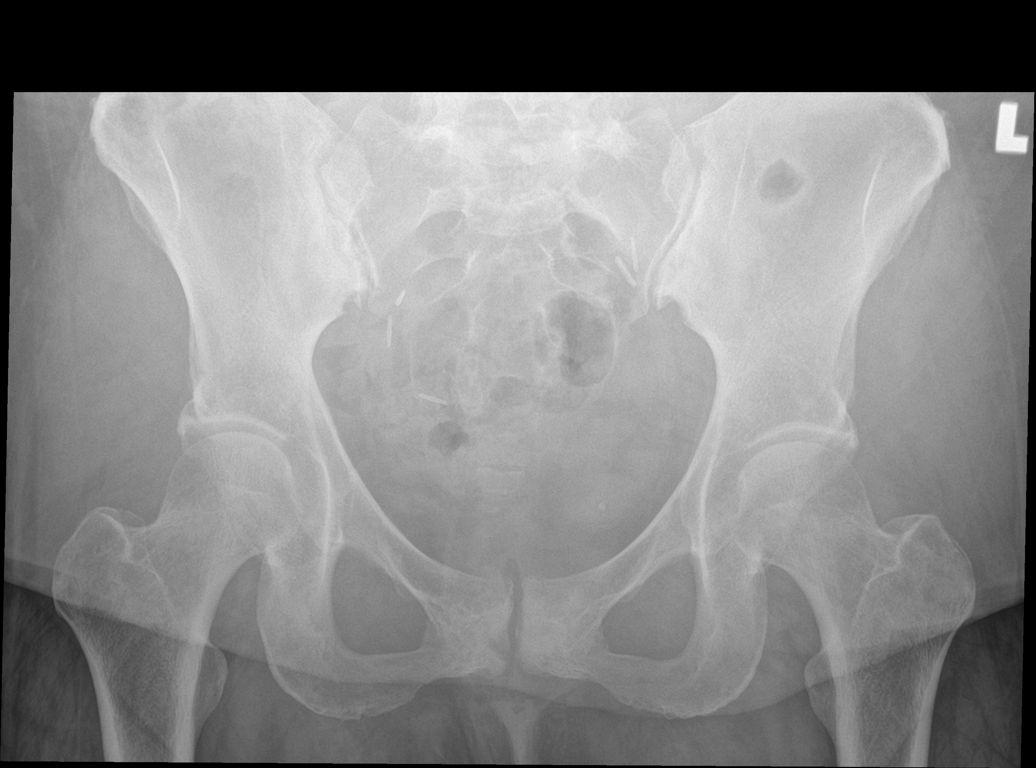

[4 of 4 positions shown; findings below may reference images not displayed]

FINDINGS: The lungs are well-aerated. Vascular congestion is noted. Increased
interstitial markings raise concern for pulmonary edema. There is no
evidence of pleural effusion or pneumothorax. The cardiomediastinal
silhouette is mildly enlarged.

The visualized bowel gas pattern is unremarkable. Scattered stool
and air are seen within the colon; there is no evidence of small
bowel dilatation to suggest obstruction. No free intra-abdominal air
is identified on the provided upright view. Clips are noted within
the right upper quadrant, reflecting prior cholecystectomy. Clips
are also noted at the right hemipelvis.

No acute osseous abnormalities are seen; the sacroiliac joints are
unremarkable in appearance. Mild degenerative change is noted at the
pubic symphysis.
IMPRESSION: 1. Unremarkable bowel gas pattern; no free intra-abdominal air seen.
Small to moderate amount of stool noted in the colon.
2. Vascular congestion and mild cardiomegaly. Increased interstitial
markings raise concern for pulmonary edema.

## 2018-03-15 ENCOUNTER — Telehealth: Payer: Self-pay | Admitting: Cardiovascular Disease

## 2018-03-15 DIAGNOSIS — E876 Hypokalemia: Secondary | ICD-10-CM

## 2018-03-15 NOTE — Telephone Encounter (Signed)
Let's check K level first.

## 2018-03-15 NOTE — Telephone Encounter (Signed)
Pt called in wanting her potassium refilled. Looking back she saw a gastro doc back in November 2018. Seems it was dc'd at that time. Pt states she needs potassium levels checked also, as she has not had any recent lab work. Please advise.

## 2018-03-15 NOTE — Telephone Encounter (Signed)
Lab order placed. Pt notified.

## 2018-03-15 NOTE — Telephone Encounter (Signed)
Please give pt a call concerning her potassium Rx, it's not on her medication list and she's needing a refill

## 2018-05-28 ENCOUNTER — Other Ambulatory Visit: Payer: Self-pay | Admitting: Cardiovascular Disease

## 2018-05-29 ENCOUNTER — Telehealth: Payer: Self-pay | Admitting: Cardiovascular Disease

## 2018-05-29 ENCOUNTER — Other Ambulatory Visit: Payer: Self-pay | Admitting: Cardiovascular Disease

## 2018-05-29 DIAGNOSIS — I712 Thoracic aortic aneurysm, without rupture, unspecified: Secondary | ICD-10-CM

## 2018-05-29 MED ORDER — METOPROLOL TARTRATE 25 MG PO TABS: 25.0000 mg | ORAL_TABLET | Freq: Two times a day (BID) | ORAL | 6 refills | Status: DC

## 2018-05-29 NOTE — Telephone Encounter (Signed)
Refill sent, order for chest ct entered,bmet Phil Dopp made aware

## 2018-05-29 NOTE — Telephone Encounter (Signed)
Needing refill for metoprolol tartrate (LOPRESSOR) 25 MG tablet [[id number]]  Send to [facility]   Also, there's a recall in for pt to have a CT chest, but there is no order in.

## 2018-06-21 ENCOUNTER — Ambulatory Visit (HOSPITAL_COMMUNITY)
Admission: RE | Admit: 2018-06-21 | Discharge: 2018-06-21 | Disposition: A | Discharge status: Home / Self Care | Payer: Self-pay | Source: Ambulatory Visit | Attending: Cardiovascular Disease | Admitting: Cardiovascular Disease

## 2018-06-21 DIAGNOSIS — I712 Thoracic aortic aneurysm, without rupture, unspecified: Secondary | ICD-10-CM

## 2018-06-21 LAB — POCT I-STAT CREATININE: CREATININE: 0.5 mg/dL (ref 0.44–1.00)

## 2018-06-21 MED ORDER — IOPAMIDOL (ISOVUE-370) INJECTION 76%
100.0000 mL | Freq: Once | INTRAVENOUS | Status: AC | PRN
  Administered 2018-06-21: 100 mL via INTRAVENOUS

## 2018-08-01 ENCOUNTER — Ambulatory Visit: Payer: Self-pay | Admitting: Cardiovascular Disease

## 2018-08-01 DIAGNOSIS — R0989 Other specified symptoms and signs involving the circulatory and respiratory systems: Secondary | ICD-10-CM

## 2018-08-02 ENCOUNTER — Encounter: Payer: Self-pay | Admitting: Cardiovascular Disease

## 2018-11-08 ENCOUNTER — Telehealth: Payer: Self-pay | Admitting: Cardiovascular Disease

## 2018-11-08 NOTE — Telephone Encounter (Signed)
Virtual Visit Pre-Appointment Phone Call  "(Name), I am calling you today to discuss your upcoming appointment. We are currently trying to limit exposure to the virus that causes COVID-19 by seeing patients at home rather than in the office."  1. "What is the BEST phone number to call the day of the visit?" - include this in appointment notes  2. Do you have or have access to (through a family member/friend) a smartphone with video capability that we can use for your visit?" a. If yes - list this number in appt notes as cell (if different from BEST phone #) and list the appointment type as a VIDEO visit in appointment notes b. If no - list the appointment type as a PHONE visit in appointment notes  3. Confirm consent - "In the setting of the current Covid19 crisis, you are scheduled for a (phone or video) visit with your provider on (date) at (time).  Just as we do with many in-office visits, in order for you to participate in this visit, we must obtain consent.  If you'd like, I can send this to your mychart (if signed up) or email for you to review.  Otherwise, I can obtain your verbal consent now.  All virtual visits are billed to your insurance company just like a normal visit would be.  By agreeing to a virtual visit, we'd like you to understand that the technology does not allow for your provider to perform an examination, and thus may limit your provider's ability to fully assess your condition. If your provider identifies any concerns that need to be evaluated in person, we will make arrangements to do so.  Finally, though the technology is pretty good, we cannot assure that it will always work on either your or our end, and in the setting of a video visit, we may have to convert it to a phone-only visit.  In either situation, we cannot ensure that we have a secure connection.  Are you willing to proceed?" STAFF: Did the patient verbally acknowledge consent to telehealth visit? Document  YES/NO here: Yes  4. Advise patient to be prepared - "Two hours prior to your appointment, go ahead and check your blood pressure, pulse, oxygen saturation, and your weight (if you have the equipment to check those) and write them all down. When your visit starts, your provider will ask you for this information. If you have an Apple Watch or Kardia device, please plan to have heart rate information ready on the day of your appointment. Please have a pen and paper handy nearby the day of the visit as well."  5. Give patient instructions for MyChart download to smartphone OR [email or web address] as below if video visit (depending on what platform provider is using)  6. Inform patient they will receive a phone call 15 minutes prior to their appointment time (may be from unknown caller ID) so they should be prepared to answer    TELEPHONE CALL NOTE  Elaine [name] has been deemed a candidate for a follow-up tele-health visit to limit community exposure during the Covid-19 pandemic. I spoke with the patient via phone to ensure availability of phone/video source, confirm preferred email & phone number, and discuss instructions and expectations.  I reminded Elaine [name] to be prepared with any vital sign and/or heart rhythm information that could potentially be obtained via home monitoring, at the time of her visit. I reminded Elaine [name] to expect a phone call prior to  her visit.  Elaine [name] 11/08/2018 3:41 PM

## 2018-11-14 ENCOUNTER — Telehealth: Payer: Self-pay | Admitting: Cardiovascular Disease

## 2018-11-15 ENCOUNTER — Ambulatory Visit: Payer: Self-pay | Admitting: Cardiovascular Disease

## 2018-12-22 ENCOUNTER — Other Ambulatory Visit: Payer: Self-pay | Admitting: Cardiovascular Disease

## 2019-01-15 ENCOUNTER — Ambulatory Visit: Payer: Self-pay | Admitting: Student

## 2019-01-15 NOTE — Progress Notes (Deleted)
Cardiology Office Note    Date:  01/15/2019   ID:  Elaine [name], DOB 1965/12/19, MRN [id number]  PCP:  Raiford Simmonds., PA-C  Cardiologist: Kate Sable, MD    No chief complaint on file.   History of Present Illness:    Elaine [name] is a 53 y.o. female with past medical history of paroxysmal atrial fibrillation, HTN, and aortic aneurysm (3.8 cm by CTA in 06/2018) who presents for overdue follow-up.   She was last examined by Dr. Bronson Ing in 05/2017 and denied any recent chest pain or dyspnea on exertion at that time. Repeat CTA from [facility] showed a 3.9 cm aortic aneurysm and continued follow-up of this was recommended.     Past Medical History:  Diagnosis Date  . Anemia   . Atrial fibrillation ([place])   . Endometriosis   . Fibroids   . GERD (gastroesophageal reflux disease)   . Hypertension   . Pancreatitis 09/2010  . Tachycardia     Past Surgical History:  Procedure Laterality Date  . ERCP W/ SPHICTEROTOMY  2012   Dr. Laural Golden: removal of some debris from CBD.  Marland Kitchen LAPAROSCOPIC CHOLECYSTECTOMY  09/2010   [facility]  . LAPAROSCOPIC TUBAL LIGATION  1995  . SUPRACERVICAL ABDOMINAL HYSTERECTOMY  02/15/2012   Procedure: HYSTERECTOMY SUPRACERVICAL ABDOMINAL;  Surgeon: Florian Buff, MD;  Location: [facility];  Service: Gynecology;  Laterality: N/A;  . TONSILLECTOMY     child    Current Medications: Outpatient Medications Prior to Visit  Medication Sig Dispense Refill  . ALPRAZolam (XANAX) 0.5 MG tablet Take 0.5 mg by mouth as needed for anxiety.    Marland Kitchen apixaban (ELIQUIS) 5 MG TABS tablet Take 1 tablet (5 mg total) by mouth 2 (two) times daily. (Patient taking differently: Take 2.5 mg 2 (two) times daily by mouth. ) 60 tablet 11  . esomeprazole (NEXIUM) 40 MG capsule Take 1 capsule (40 mg total) by mouth daily before breakfast. 30 capsule 0  . metoprolol tartrate (LOPRESSOR) 25 MG tablet TAKE 1 TABLET BY MOUTH TWICE DAILY PATIENT  NEEDS  APPOINTMENT  FOR  FURTHER  REFILLS  60 tablet 0  . pantoprazole (PROTONIX) 20 MG tablet Take 1 tablet (20 mg total) daily by mouth. 30 tablet 1   No facility-administered medications prior to visit.      Allergies:   Hydrocodone, Vicodin [hydrocodone-acetaminophen], and Fentanyl   Social History   Socioeconomic History  . Marital status: Legally Separated    Spouse name: Not on file  . Number of children: 6  . Years of education: 1  . Highest education level: Not on file  Occupational History  . Occupation: CNA    Comment: Research scientist (physical sciences)  Social Needs  . Financial resource strain: Not on file  . Food insecurity    Worry: Not on file    Inability: Not on file  . Transportation needs    Medical: Not on file    Non-medical: Not on file  Tobacco Use  . Smoking status: Never Smoker  . Smokeless tobacco: Never Used  Substance and Sexual Activity  . Alcohol use: Yes    Comment: "socially"  . Drug use: No  . Sexual activity: Not on file  Lifestyle  . Physical activity    Days per week: Not on file    Minutes per session: Not on file  . Stress: Not on file  Relationships  . Social connections    Talks on phone: Not on file  Gets together: Not on file    Attends religious service: Not on file    Active member of club or organization: Not on file    Attends meetings of clubs or organizations: Not on file    Relationship status: Not on file  Other Topics Concern  . Not on file  Social History Narrative  . Not on file     Family History:  The patient's ***family history includes CAD in her maternal grandmother; Diabetes in her maternal grandmother; Heart attack in her father; Non-Hodgkin's lymphoma in her mother; Thyroid disease in her sister.   Review of Systems:   Please see the history of present illness.     General:  No chills, fever, night sweats or weight changes.  Cardiovascular:  No chest pain, dyspnea on exertion, edema, orthopnea, palpitations, paroxysmal nocturnal dyspnea.  Dermatological: No rash, lesions/masses Respiratory: No cough, dyspnea Urologic: No hematuria, dysuria Abdominal:   No nausea, vomiting, diarrhea, bright red blood per rectum, melena, or hematemesis Neurologic:  No visual changes, wkns, changes in mental status. All other systems reviewed and are otherwise negative except as noted above.   Physical Exam:    VS:  LMP 02/14/2012    General: Well developed, well nourished,female appearing in no acute distress. Head: Normocephalic, atraumatic, sclera non-icteric, no xanthomas, nares are without discharge.  Neck: No carotid bruits. JVD not elevated.  Lungs: Respirations regular and unlabored, without wheezes or rales.  Heart: ***Regular rate and rhythm. No S3 or S4.  No murmur, no rubs, or gallops appreciated. Abdomen: Soft, non-tender, non-distended with normoactive bowel sounds. No hepatomegaly. No rebound/guarding. No obvious abdominal masses. Msk:  Strength and tone appear normal for age. No joint deformities or effusions. Extremities: No clubbing or cyanosis. No edema.  Distal pedal pulses are 2+ bilaterally. Neuro: Alert and oriented X 3. Moves all extremities spontaneously. No focal deficits noted. Psych:  Responds to questions appropriately with a normal affect. Skin: No rashes or lesions noted  Wt Readings from Last 3 Encounters:  06/07/17 241 lb 3.2 oz (109.4 kg)  05/22/17 248 lb (112.5 kg)  05/19/17 248 lb (112.5 kg)        Studies/Labs Reviewed:   EKG:  EKG is*** ordered today.  The ekg ordered today demonstrates ***  Recent Labs: 06/21/2018: Creatinine, Ser 0.50   Lipid Panel    Component Value Date/Time   CHOL 125 05/06/2016 0412   TRIG 148 05/06/2016 0412   HDL 27 (L) 05/06/2016 0412   CHOLHDL 4.6 05/06/2016 0412   VLDL 30 05/06/2016 0412   LDLCALC 68 05/06/2016 0412    Additional studies/ records that were reviewed today include:   Echocardiogram: 04/2016 Study Conclusions  - Procedure narrative:  Transthoracic echocardiography. Image   quality was suboptimal. The study was technically difficult, as a   result of poor acoustic windows, poor sound wave transmission,   and body habitus. Intravenous contrast (Definity) was   administered. - Left ventricle: The cavity size was normal. Wall thickness was   increased in a pattern of mild LVH. Systolic function was   vigorous. The estimated ejection fraction was in the range of 65%   to 70%. Wall motion was normal; there were no regional wall   motion abnormalities. Doppler parameters are consistent with   abnormal left ventricular relaxation (grade 1 diastolic   dysfunction).  CTA: 06/2018 IMPRESSION: 1. No evidence of overt aneurysmal disease of the thoracic aorta. Maximal diameter of the ascending thoracic aorta is 3.7-3.8 cm which  is at the upper end of normal. No evidence of significant atherosclerosis. Any need for follow-up CTA is debatable and not necessarily recommended by any consensus recommendations. Follow-up echocardiography may be an easier method to assure stability and normal caliber of the aortic root and proximal ascending thoracic aorta. 2. Prior cholecystectomy and evidence of prior sphincterotomy with pneumobilia.  Assessment:    No diagnosis found.   Plan:   In order of problems listed above:  1. Paroxysmal Atrial Fibrillation/ Use of Long-Term Anticoagulation - ***  2. HTN - ***  3. Aortic Aneurysm - ***   Medication Adjustments/Labs and Tests Ordered: Current medicines are reviewed at length with the patient today.  Concerns regarding medicines are outlined above.  Medication changes, Labs and Tests ordered today are listed in the Patient Instructions below. There are no Patient Instructions on file for this visit.   Signed, Erma Heritage, PA-C  01/15/2019 7:19 AM    [facility] [address] [place], [place] [address] Phone: [phone] Fax: [phone]

## 2019-01-23 ENCOUNTER — Ambulatory Visit: Payer: Self-pay | Admitting: Cardiovascular Disease

## 2019-01-24 ENCOUNTER — Telehealth: Payer: Self-pay | Admitting: Cardiovascular Disease

## 2019-01-29 ENCOUNTER — Other Ambulatory Visit: Payer: Self-pay | Admitting: Cardiovascular Disease

## 2019-04-03 NOTE — Telephone Encounter (Signed)
Error

## 2019-04-16 ENCOUNTER — Encounter: Payer: Self-pay | Admitting: Cardiovascular Disease

## 2019-04-16 NOTE — Progress Notes (Deleted)
{Choose 1 Note Type (Telehealth Visit or Telephone Visit):[phone]}   Date:  04/16/2019   ID:  Elaine [name], DOB [birth year], MRN [id number]  Patient Location: Home Provider Location: Office  PCP:  Raiford Simmonds., PA-C  Cardiologist:  Kate Sable, MD  Electrophysiologist:  None   Evaluation Performed:  Follow-Up Visit  Chief Complaint:  PAF  History of Present Illness:    Elaine [name] is a 53 y.o. female with paroxysmal atrial fibrillation and hypertension.  I last evaluated her in November 2018.  The patient {does/does not:200015} have symptoms concerning for COVID-19 infection (fever, chills, cough, or new shortness of breath).    Past Medical History:  Diagnosis Date  . Anemia   . Atrial fibrillation ([place])   . Endometriosis   . Fibroids   . GERD (gastroesophageal reflux disease)   . Hypertension   . Pancreatitis 09/2010  . Tachycardia    Past Surgical History:  Procedure Laterality Date  . ERCP W/ SPHICTEROTOMY  2012   Dr. Laural Golden: removal of some debris from CBD.  Marland Kitchen LAPAROSCOPIC CHOLECYSTECTOMY  09/2010   [facility]  . LAPAROSCOPIC TUBAL LIGATION  1995  . SUPRACERVICAL ABDOMINAL HYSTERECTOMY  02/15/2012   Procedure: HYSTERECTOMY SUPRACERVICAL ABDOMINAL;  Surgeon: Florian Buff, MD;  Location: [facility];  Service: Gynecology;  Laterality: N/A;  . TONSILLECTOMY     child     No outpatient medications have been marked as taking for the 04/16/19 encounter (Appointment) with Herminio Commons, MD.     Allergies:   Hydrocodone, Vicodin [hydrocodone-acetaminophen], and Fentanyl   Social History   Tobacco Use  . Smoking status: Never Smoker  . Smokeless tobacco: Never Used  Substance Use Topics  . Alcohol use: Yes    Comment: "socially"  . Drug use: No     Family Hx: The patient's family history includes CAD in her maternal grandmother; Diabetes in her maternal grandmother; Heart attack in her father; Non-Hodgkin's lymphoma in her mother; Thyroid  disease in her sister.  ROS:   Please see the history of present illness.    *** All other systems reviewed and are negative.   Prior CV studies:   The following studies were reviewed today:  ***  Labs/Other Tests and Data Reviewed:    EKG:  {EKG/Telemetry Strips Reviewed:[phone]}  Recent Labs: 06/21/2018: Creatinine, Ser 0.50   Recent Lipid Panel Lab Results  Component Value Date/Time   CHOL 125 05/06/2016 04:12 AM   TRIG 148 05/06/2016 04:12 AM   HDL 27 (L) 05/06/2016 04:12 AM   CHOLHDL 4.6 05/06/2016 04:12 AM   LDLCALC 68 05/06/2016 04:12 AM    Wt Readings from Last 3 Encounters:  06/07/17 241 lb 3.2 oz (109.4 kg)  05/22/17 248 lb (112.5 kg)  05/19/17 248 lb (112.5 kg)     Objective:    Vital Signs:  LMP 02/14/2012    {[facility] Virtual Exam (Optional):[phone]::"VITAL SIGNS:  reviewed"}  ASSESSMENT & PLAN:    1.  Paroxysmal atrial fibrillation: Symptomatically stable.  I will refill metoprolol at 25 mg twice daily.  Continue Eliquis for anticoagulation.  I have recommended she take 5 mg twice daily. CHADSVASC score is at least 2 for hypertension and gender.  2.  Hypertension: Controlled on present therapy.  No changes.     COVID-19 Education: The signs and symptoms of COVID-19 were discussed with the patient and how to seek care for testing (follow up with PCP or arrange E-visit).  ***The importance of social distancing was  discussed today.  Time:   Today, I have spent *** minutes with the patient with telehealth technology discussing the above problems.     Medication Adjustments/Labs and Tests Ordered: Current medicines are reviewed at length with the patient today.  Concerns regarding medicines are outlined above.   Tests Ordered: No orders of the defined types were placed in this encounter.   Medication Changes: No orders of the defined types were placed in this encounter.   Follow Up:  {F/U Format:[phone]} {follow  up:15908}  Signed, Kate Sable, MD  04/16/2019 3:59 PM     [facility]

## 2019-04-17 ENCOUNTER — Encounter: Payer: Self-pay | Admitting: Cardiovascular Disease

## 2019-04-18 NOTE — Progress Notes (Signed)
This encounter was created in error - please disregard.

## 2019-06-19 ENCOUNTER — Other Ambulatory Visit: Payer: Self-pay | Admitting: Cardiovascular Disease

## 2019-06-20 ENCOUNTER — Other Ambulatory Visit: Payer: Self-pay | Admitting: Cardiovascular Disease

## 2019-06-20 MED ORDER — METOPROLOL TARTRATE 25 MG PO TABS: 25.0000 mg | ORAL_TABLET | Freq: Two times a day (BID) | ORAL | 0 refills | Status: DC

## 2019-06-20 NOTE — Telephone Encounter (Signed)
°*  STAT* If patient is at the pharmacy, call can be transferred to refill team.   1. Which medications need to be refilled? metoprolol tartrate (LOPRESSOR) 25 MG tablet   2. Which pharmacy/location (including street and city if local pharmacy) is medication to be sent to? [place]   3. Do they need a 30 day or 90 day supply?   needs enough to last until Dec 21,2020

## 2019-07-08 ENCOUNTER — Telehealth (INDEPENDENT_AMBULATORY_CARE_PROVIDER_SITE_OTHER): Payer: Self-pay | Admitting: Cardiovascular Disease

## 2019-07-08 ENCOUNTER — Encounter: Payer: Self-pay | Admitting: Cardiovascular Disease

## 2019-07-08 VITALS — BP 136/82 | HR 72 | Ht 61.0 in | Wt 247.0 lb

## 2019-07-08 DIAGNOSIS — I480 Paroxysmal atrial fibrillation: Secondary | ICD-10-CM

## 2019-07-08 DIAGNOSIS — I10 Essential (primary) hypertension: Secondary | ICD-10-CM

## 2019-07-08 NOTE — Progress Notes (Signed)
Virtual Visit via Telephone Note   This visit type was conducted due to national recommendations for restrictions regarding the COVID-19 Pandemic (e.g. social distancing) in an effort to limit this patient's exposure and mitigate transmission in our community.  Due to her co-morbid illnesses, this patient is at least at moderate risk for complications without adequate follow up.  This format is felt to be most appropriate for this patient at this time.  The patient did not have access to video technology/had technical difficulties with video requiring transitioning to audio format only (telephone).  All issues noted in this document were discussed and addressed.  No physical exam could be performed with this format.  Please refer to the patient's chart for her  consent to telehealth for [facility].   Date:  07/08/2019   ID:  Elaine [name], DOB 03/03/1966, MRN [id number]  Patient Location: Home Provider Location: Office  PCP:  Raiford Simmonds., PA-C  Cardiologist:  Kate Sable, MD  Electrophysiologist:  None   Evaluation Performed:  Follow-Up Visit  Chief Complaint:  PAF  History of Present Illness:    Elaine [name] is a 53 y.o. female with paroxysmal atrial fibrillation and hypertension.  CT angiography of the chest on 06/21/2018 showed no evidence of overt aneurysmal disease of the thoracic aorta with a maximal diameter of ascending thoracic aorta of 3.7-3.8 cm which is at the upper end of normal.  The patient denies any symptoms of chest pain, palpitations, shortness of breath, lightheadedness, dizziness, leg swelling, orthopnea, PND, and syncope.  Episode of atrial fibrillation was an isolated instance in October 2017.  She has not had palpitations since that time.  She could not afford Eliquis and started taking aspirin.   Past Medical History:  Diagnosis Date  . Anemia   . Atrial fibrillation ([place])   . Endometriosis   . Fibroids   . GERD (gastroesophageal  reflux disease)   . Hypertension   . Pancreatitis 09/2010  . Tachycardia    Past Surgical History:  Procedure Laterality Date  . ERCP W/ SPHICTEROTOMY  2012   Dr. Laural Golden: removal of some debris from CBD.  Marland Kitchen LAPAROSCOPIC CHOLECYSTECTOMY  09/2010   [facility]  . LAPAROSCOPIC TUBAL LIGATION  1995  . SUPRACERVICAL ABDOMINAL HYSTERECTOMY  02/15/2012   Procedure: HYSTERECTOMY SUPRACERVICAL ABDOMINAL;  Surgeon: Florian Buff, MD;  Location: [facility];  Service: Gynecology;  Laterality: N/A;  . TONSILLECTOMY     child     Current Meds  Medication Sig  . aspirin EC 81 MG tablet Take 81 mg by mouth daily.  . metoprolol tartrate (LOPRESSOR) 25 MG tablet Take 1 tablet (25 mg total) by mouth 2 (two) times daily.     Allergies:   Hydrocodone, Vicodin [hydrocodone-acetaminophen], and Fentanyl   Social History   Tobacco Use  . Smoking status: Never Smoker  . Smokeless tobacco: Never Used  Substance Use Topics  . Alcohol use: Yes    Comment: "socially"  . Drug use: No     Family Hx: The patient's family history includes CAD in her maternal grandmother; Diabetes in her maternal grandmother; Heart attack in her father; Non-Hodgkin's lymphoma in her mother; Thyroid disease in her sister.  ROS:   Please see the history of present illness.     All other systems reviewed and are negative.   Prior CV studies:   The following studies were reviewed today:  NA  Labs/Other Tests and Data Reviewed:    EKG:  No  ECG reviewed.  Recent Labs: No results found for requested labs within last 8760 hours.   Recent Lipid Panel Lab Results  Component Value Date/Time   CHOL 125 05/06/2016 04:12 AM   TRIG 148 05/06/2016 04:12 AM   HDL 27 (L) 05/06/2016 04:12 AM   CHOLHDL 4.6 05/06/2016 04:12 AM   LDLCALC 68 05/06/2016 04:12 AM    Wt Readings from Last 3 Encounters:  07/08/19 247 lb (112 kg)  06/07/17 241 lb 3.2 oz (109.4 kg)  05/22/17 248 lb (112.5 kg)     Objective:    Vital Signs:  BP  136/82   Pulse 72   Ht 5\' 1"  (1.549 m)   Wt 247 lb (112 kg)   LMP 02/14/2012   BMI 46.67 kg/m    VITAL SIGNS:  reviewed  ASSESSMENT & PLAN:    1.  Paroxysmal atrial fibrillation: Currently on Lopressor 25 mg twice daily.  No longer taking Eliquis for anticoagulation.  She takes aspirin.  Episode of atrial fibrillation was an isolated instance occurring in October 2017.  She has not had palpitations since that time.  Given the updated guidelines for systemic anticoagulation, CHADSVASC score is 1 for HTN.  Hence, I think taking aspirin is reasonable.  2.  Hypertension: Blood pressure is normal.  No changes to therapy.    COVID-19 Education: The signs and symptoms of COVID-19 were discussed with the patient and how to seek care for testing (follow up with PCP or arrange E-visit).  The importance of social distancing was discussed today.  Time:   Today, I have spent 10 minutes with the patient with telehealth technology discussing the above problems.     Medication Adjustments/Labs and Tests Ordered: Current medicines are reviewed at length with the patient today.  Concerns regarding medicines are outlined above.   Tests Ordered: No orders of the defined types were placed in this encounter.   Medication Changes: No orders of the defined types were placed in this encounter.   Follow Up:  Virtual Visit  prn  Signed, Kate Sable, MD  07/08/2019 2:48 PM    [place]

## 2019-07-08 NOTE — Patient Instructions (Signed)
Medication Instructions:  Your physician recommends that you continue on your current medications as directed. Please refer to the Current Medication list given to you today.  *If you need a refill on your cardiac medications before your next appointment, please call your pharmacy*  Lab Work: None today If you have labs (blood work) drawn today and your tests are completely normal, you will receive your results only by: Marland Kitchen MyChart Message (if you have MyChart) OR . A paper copy in the mail If you have any lab test that is abnormal or we need to change your treatment, we will call you to review the results.  Testing/Procedures: None today  Follow-Up: At [facility], you and your health needs are our priority.  As part of our continuing mission to provide you with exceptional heart care, we have created designated Provider Care Teams.  These Care Teams include your primary Cardiologist (physician) and Advanced Practice Providers (APPs -  Physician Assistants and Nurse Practitioners) who all work together to provide you with the care you need, when you need it.  Your next appointment:  Follow up as needed.    Thank you for choosing [facility] !

## 2019-08-07 ENCOUNTER — Other Ambulatory Visit: Payer: Self-pay | Admitting: Cardiovascular Disease

## 2020-08-21 ENCOUNTER — Other Ambulatory Visit: Payer: Self-pay | Admitting: *Deleted

## 2020-08-27 ENCOUNTER — Telehealth: Payer: Self-pay | Admitting: Family Medicine

## 2020-08-27 MED ORDER — METOPROLOL TARTRATE 25 MG PO TABS: 25.0000 mg | ORAL_TABLET | Freq: Two times a day (BID) | ORAL | 0 refills | Status: AC

## 2020-08-27 NOTE — Telephone Encounter (Signed)
Medication sent to pharmacy  

## 2020-08-27 NOTE — Telephone Encounter (Signed)
*  STAT* If patient is at the pharmacy, call can be transferred to refill team.   1. Which medications need to be refilled? (please list name of each medication and dose if known)  metoprolol tartrate (LOPRESSOR) 25 MG tablet     2. Which pharmacy/location (including street and city if local pharmacy) is medication to be sent to?  [facility]  3. Do they need a 30 day or 90 day supply?   Patient has made appointment with A.Quinn. She is out of medication.

## 2020-09-06 NOTE — Progress Notes (Deleted)
Cardiology Office Note  Date: 09/06/2020   ID: Neria, Procter 10/11/1965, MRN [id number]  PCP:  Raiford Simmonds., PA-C  Cardiologist:  Kate Sable, MD (Inactive) Electrophysiologist:  None   Chief Complaint: Cardiac follow up  History of Present Illness: Elaine [name] is a 55 y.o. female with a history of atrial fibrillation, hypertension, GERD.  Last encounter with Dr. Bronson Ing via telemedicine 07/08/2019.  She denied any chest pain, shortness of breath, palpitations, PND, orthopnea, syncope, edema, dizziness, lightheadedness.  Previous episode of atrial fibrillation described as isolated instance October 2017.  Could not afford Eliquis and started aspirin.  She was continuing metoprolol 25 mg p.o. twice daily.  Continuing aspirin.  CHA2DS2-VASc score of 1.  Blood pressure was normal.  There were no changes to therapy.  Past Medical History:  Diagnosis Date  . Anemia   . Atrial fibrillation ([place])   . Endometriosis   . Fibroids   . GERD (gastroesophageal reflux disease)   . Hypertension   . Pancreatitis 09/2010  . Tachycardia     Past Surgical History:  Procedure Laterality Date  . ERCP W/ SPHICTEROTOMY  2012   Dr. Laural Golden: removal of some debris from CBD.  Marland Kitchen LAPAROSCOPIC CHOLECYSTECTOMY  09/2010   [facility]  . LAPAROSCOPIC TUBAL LIGATION  1995  . SUPRACERVICAL ABDOMINAL HYSTERECTOMY  02/15/2012   Procedure: HYSTERECTOMY SUPRACERVICAL ABDOMINAL;  Surgeon: Florian Buff, MD;  Location: [facility];  Service: Gynecology;  Laterality: N/A;  . TONSILLECTOMY     child    Current Outpatient Medications  Medication Sig Dispense Refill  . ALPRAZolam (XANAX) 0.5 MG tablet Take 0.5 mg by mouth as needed for anxiety.    Marland Kitchen aspirin EC 81 MG tablet Take 81 mg by mouth daily.    . metoprolol tartrate (LOPRESSOR) 25 MG tablet Take 1 tablet (25 mg total) by mouth 2 (two) times daily. 180 tablet 0   No current facility-administered medications for this visit.   Allergies:   Hydrocodone, Vicodin [hydrocodone-acetaminophen], and Fentanyl   Social History: The patient  reports that she has never smoked. She has never used smokeless tobacco. She reports current alcohol use. She reports that she does not use drugs.   Family History: The patient's family history includes CAD in her maternal grandmother; Diabetes in her maternal grandmother; Heart attack in her father; Non-Hodgkin's lymphoma in her mother; Thyroid disease in her sister.   ROS:  Please see the history of present illness. Otherwise, complete review of systems is positive for {NONE DEFAULTED:18576::"none"}.  All other systems are reviewed and negative.   Physical Exam: VS:  LMP 02/14/2012 , BMI There is no height or weight on file to calculate BMI.  Wt Readings from Last 3 Encounters:  07/08/19 247 lb (112 kg)  06/07/17 241 lb 3.2 oz (109.4 kg)  05/22/17 248 lb (112.5 kg)    General: Patient appears comfortable at rest. HEENT: Conjunctiva and lids normal, oropharynx clear with moist mucosa. Neck: Supple, no elevated JVP or carotid bruits, no thyromegaly. Lungs: Clear to auscultation, nonlabored breathing at rest. Cardiac: Regular rate and rhythm, no S3 or significant systolic murmur, no pericardial rub. Abdomen: Soft, nontender, no hepatomegaly, bowel sounds present, no guarding or rebound. Extremities: No pitting edema, distal pulses 2+. Skin: Warm and dry. Musculoskeletal: No kyphosis. Neuropsychiatric: Alert and oriented x3, affect grossly appropriate.  ECG:  {EKG/Telemetry Strips Reviewed:[id number]}  Recent Labwork: No results found for requested labs within last 8760 hours.     Component  Value Date/Time   CHOL 125 05/06/2016 0412   TRIG 148 05/06/2016 0412   HDL 27 (L) 05/06/2016 0412   CHOLHDL 4.6 05/06/2016 0412   VLDL 30 05/06/2016 0412   LDLCALC 68 05/06/2016 0412    Other Studies Reviewed Today:  CTA chest 06/21/2018 IMPRESSION: 1. No evidence of overt aneurysmal disease of  the thoracic aorta. Maximal diameter of the ascending thoracic aorta is 3.7-3.8 cm which is at the upper end of normal. No evidence of significant atherosclerosis. Any need for follow-up CTA is debatable and not necessarily recommended by any consensus recommendations. Follow-up echocardiography may be an easier method to assure stability and normal caliber of the aortic root and proximal ascending thoracic aorta. 2. Prior cholecystectomy and evidence of prior sphincterotomy with pneumobilia    Echocardiogram 05/06/2016 Study Conclusions   - Procedure narrative: Transthoracic echocardiography. Image  quality was suboptimal. The study was technically difficult, as a  result of poor acoustic windows, poor sound wave transmission,  and body habitus. Intravenous contrast (Definity) was  administered.  - Left ventricle: The cavity size was normal. Wall thickness was  increased in a pattern of mild LVH. Systolic function was  vigorous. The estimated ejection fraction was in the range of 65%  to 70%. Wall motion was normal; there were no regional wall  motion abnormalities. Doppler parameters are consistent with  abnormal left ventricular relaxation (grade 1 diastolic  dysfunction).   Assessment and Plan:  1. Paroxysmal atrial fibrillation (HCC)   2. Essential hypertension      Medication Adjustments/Labs and Tests Ordered: Current medicines are reviewed at length with the patient today.  Concerns regarding medicines are outlined above.   Disposition: Follow-up with ***  Signed, Levell July, NP 09/06/2020 10:05 PM    [facility] at [facility] [place], [place], [place] [address] Phone: [phone]; Fax: [phone]

## 2020-09-07 ENCOUNTER — Ambulatory Visit: Payer: Self-pay | Admitting: Family Medicine

## 2020-09-07 DIAGNOSIS — I10 Essential (primary) hypertension: Secondary | ICD-10-CM

## 2020-09-07 DIAGNOSIS — I480 Paroxysmal atrial fibrillation: Secondary | ICD-10-CM

## 2020-09-17 NOTE — Progress Notes (Deleted)
Cardiology Office Note  Date: 09/17/2020   ID: Elaine [name], DOB [birth year], MRN [id number]  PCP:  Raiford Simmonds., PA-C  Cardiologist:  No primary care provider on file. Electrophysiologist:  None   Chief Complaint: Cardiac follow up  History of Present Illness: Elaine [name] is a 55 y.o. female with a history of atrial fibrillation, hypertension, GERD.  Last encounter with Dr. Bronson Ing via telemedicine 07/08/2019.  She denied any chest pain, shortness of breath, palpitations, PND, orthopnea, syncope, edema, dizziness, lightheadedness.  Previous episode of atrial fibrillation described as isolated instance October 2017.  Could not afford Eliquis and started aspirin.  She was continuing metoprolol 25 mg p.o. twice daily.  Continuing aspirin.  CHA2DS2-VASc score of 1.  Blood pressure was normal.  There were no changes to therapy.  Past Medical History:  Diagnosis Date  . Anemia   . Atrial fibrillation ([place])   . Endometriosis   . Fibroids   . GERD (gastroesophageal reflux disease)   . Hypertension   . Pancreatitis 09/2010  . Tachycardia     Past Surgical History:  Procedure Laterality Date  . ERCP W/ SPHICTEROTOMY  2012   Dr. Laural Golden: removal of some debris from CBD.  Marland Kitchen LAPAROSCOPIC CHOLECYSTECTOMY  09/2010   [facility]  . LAPAROSCOPIC TUBAL LIGATION  1995  . SUPRACERVICAL ABDOMINAL HYSTERECTOMY  02/15/2012   Procedure: HYSTERECTOMY SUPRACERVICAL ABDOMINAL;  Surgeon: Florian Buff, MD;  Location: [facility];  Service: Gynecology;  Laterality: N/A;  . TONSILLECTOMY     child    Current Outpatient Medications  Medication Sig Dispense Refill  . ALPRAZolam (XANAX) 0.5 MG tablet Take 0.5 mg by mouth as needed for anxiety.    Marland Kitchen aspirin EC 81 MG tablet Take 81 mg by mouth daily.    . metoprolol tartrate (LOPRESSOR) 25 MG tablet Take 1 tablet (25 mg total) by mouth 2 (two) times daily. 180 tablet 0   No current facility-administered medications for this visit.   Allergies:   Hydrocodone, Vicodin [hydrocodone-acetaminophen], and Fentanyl   Social History: The patient  reports that she has never smoked. She has never used smokeless tobacco. She reports current alcohol use. She reports that she does not use drugs.   Family History: The patient's family history includes CAD in her maternal grandmother; Diabetes in her maternal grandmother; Heart attack in her father; Non-Hodgkin's lymphoma in her mother; Thyroid disease in her sister.   ROS:  Please see the history of present illness. Otherwise, complete review of systems is positive for {NONE DEFAULTED:18576::"none"}.  All other systems are reviewed and negative.   Physical Exam: VS:  LMP 02/14/2012 , BMI There is no height or weight on file to calculate BMI.  Wt Readings from Last 3 Encounters:  07/08/19 247 lb (112 kg)  06/07/17 241 lb 3.2 oz (109.4 kg)  05/22/17 248 lb (112.5 kg)    General: Patient appears comfortable at rest. HEENT: Conjunctiva and lids normal, oropharynx clear with moist mucosa. Neck: Supple, no elevated JVP or carotid bruits, no thyromegaly. Lungs: Clear to auscultation, nonlabored breathing at rest. Cardiac: Regular rate and rhythm, no S3 or significant systolic murmur, no pericardial rub. Abdomen: Soft, nontender, no hepatomegaly, bowel sounds present, no guarding or rebound. Extremities: No pitting edema, distal pulses 2+. Skin: Warm and dry. Musculoskeletal: No kyphosis. Neuropsychiatric: Alert and oriented x3, affect grossly appropriate.  ECG:  {EKG/Telemetry Strips Reviewed:[id number]}  Recent Labwork: No results found for requested labs within last 8760 hours.  Component Value Date/Time   CHOL 125 05/06/2016 0412   TRIG 148 05/06/2016 0412   HDL 27 (L) 05/06/2016 0412   CHOLHDL 4.6 05/06/2016 0412   VLDL 30 05/06/2016 0412   LDLCALC 68 05/06/2016 0412    Other Studies Reviewed Today:  CTA chest 06/21/2018 IMPRESSION: 1. No evidence of overt aneurysmal disease of  the thoracic aorta. Maximal diameter of the ascending thoracic aorta is 3.7-3.8 cm which is at the upper end of normal. No evidence of significant atherosclerosis. Any need for follow-up CTA is debatable and not necessarily recommended by any consensus recommendations. Follow-up echocardiography may be an easier method to assure stability and normal caliber of the aortic root and proximal ascending thoracic aorta. 2. Prior cholecystectomy and evidence of prior sphincterotomy with pneumobilia    Echocardiogram 05/06/2016 Study Conclusions   - Procedure narrative: Transthoracic echocardiography. Image  quality was suboptimal. The study was technically difficult, as a  result of poor acoustic windows, poor sound wave transmission,  and body habitus. Intravenous contrast (Definity) was  administered.  - Left ventricle: The cavity size was normal. Wall thickness was  increased in a pattern of mild LVH. Systolic function was  vigorous. The estimated ejection fraction was in the range of 65%  to 70%. Wall motion was normal; there were no regional wall  motion abnormalities. Doppler parameters are consistent with  abnormal left ventricular relaxation (grade 1 diastolic  dysfunction).   Assessment and Plan:  No diagnosis found.   Medication Adjustments/Labs and Tests Ordered: Current medicines are reviewed at length with the patient today.  Concerns regarding medicines are outlined above.   Disposition: Follow-up with ***  Signed, Levell July, NP 09/17/2020 11:44 PM    [place] at [facility] [place], [place], [place] [address] Phone: [phone]; Fax: [phone]

## 2020-09-18 ENCOUNTER — Ambulatory Visit: Payer: Self-pay | Admitting: Family Medicine
